# Patient Record
Sex: Female | Born: 1950 | Race: White | Hispanic: No
Health system: Southern US, Community
[De-identification: ages and names within clinical notes are randomized; demographics above are authoritative.]

---

## 2005-07-21 ENCOUNTER — Ambulatory Visit: Payer: Self-pay | Admitting: Unknown Physician Specialty

## 2005-09-30 ENCOUNTER — Ambulatory Visit: Payer: Self-pay | Admitting: Internal Medicine

## 2006-01-27 ENCOUNTER — Ambulatory Visit: Payer: Self-pay | Admitting: Gastroenterology

## 2006-09-11 ENCOUNTER — Ambulatory Visit: Payer: Self-pay | Admitting: Unknown Physician Specialty

## 2006-10-05 ENCOUNTER — Ambulatory Visit: Payer: Self-pay | Admitting: Unknown Physician Specialty

## 2007-10-15 ENCOUNTER — Ambulatory Visit: Payer: Self-pay | Admitting: Internal Medicine

## 2008-10-16 ENCOUNTER — Ambulatory Visit: Payer: Self-pay | Admitting: Internal Medicine

## 2009-02-16 ENCOUNTER — Ambulatory Visit: Payer: Self-pay | Admitting: Unknown Physician Specialty

## 2010-03-04 ENCOUNTER — Ambulatory Visit: Payer: Self-pay | Admitting: Internal Medicine

## 2010-03-09 ENCOUNTER — Ambulatory Visit: Payer: Self-pay | Admitting: Internal Medicine

## 2010-04-17 ENCOUNTER — Ambulatory Visit: Payer: Self-pay

## 2011-03-09 ENCOUNTER — Ambulatory Visit: Payer: Self-pay | Admitting: Internal Medicine

## 2011-10-19 ENCOUNTER — Inpatient Hospital Stay: Payer: Self-pay | Admitting: Internal Medicine

## 2011-10-19 LAB — CBC WITH DIFFERENTIAL/PLATELET
Basophil %: 0.1 %
Eosinophil #: 0 10*3/uL (ref 0.0–0.7)
Eosinophil %: 0 %
HGB: 12.2 g/dL (ref 12.0–16.0)
Lymphocyte %: 8.5 %
MCH: 30 pg (ref 26.0–34.0)
MCV: 89 fL (ref 80–100)
Monocyte #: 0.1 10*3/uL (ref 0.0–0.7)
Monocyte %: 1.5 %
Neutrophil %: 89.9 %
Platelet: 180 10*3/uL (ref 150–440)
RBC: 4.07 10*6/uL (ref 3.80–5.20)
WBC: 7.3 10*3/uL (ref 3.6–11.0)

## 2011-10-19 LAB — COMPREHENSIVE METABOLIC PANEL
Alkaline Phosphatase: 68 U/L (ref 50–136)
Calcium, Total: 8.6 mg/dL (ref 8.5–10.1)
Creatinine: 1.58 mg/dL — ABNORMAL HIGH (ref 0.60–1.30)
EGFR (African American): 43 — ABNORMAL LOW
EGFR (Non-African Amer.): 35 — ABNORMAL LOW
Glucose: 171 mg/dL — ABNORMAL HIGH (ref 65–99)
Potassium: 3.9 mmol/L (ref 3.5–5.1)
SGOT(AST): 28 U/L (ref 15–37)
SGPT (ALT): 25 U/L
Sodium: 130 mmol/L — ABNORMAL LOW (ref 136–145)
Total Protein: 6.8 g/dL (ref 6.4–8.2)

## 2011-10-19 LAB — URINALYSIS, COMPLETE
Bilirubin,UR: NEGATIVE
Hyaline Cast: 6
Ph: 5 (ref 4.5–8.0)
Specific Gravity: 1.028 (ref 1.003–1.030)
Squamous Epithelial: 2

## 2011-10-19 LAB — CK-MB: CK-MB: 2.8 ng/mL (ref 0.5–3.6)

## 2011-10-20 LAB — CBC WITH DIFFERENTIAL/PLATELET
Basophil #: 0 10*3/uL (ref 0.0–0.1)
Basophil %: 0.3 %
Eosinophil %: 0 %
HCT: 36 % (ref 35.0–47.0)
Lymphocyte #: 0.7 10*3/uL — ABNORMAL LOW (ref 1.0–3.6)
MCH: 29.9 pg (ref 26.0–34.0)
MCV: 90 fL (ref 80–100)
Monocyte #: 0.1 10*3/uL (ref 0.0–0.7)
Monocyte %: 1.1 %
Neutrophil #: 7.3 10*3/uL — ABNORMAL HIGH (ref 1.4–6.5)
Platelet: 190 10*3/uL (ref 150–440)
RBC: 4.02 10*6/uL (ref 3.80–5.20)

## 2011-10-20 LAB — TROPONIN I: Troponin-I: <0.02 ng/mL

## 2011-10-20 LAB — BASIC METABOLIC PANEL
Anion Gap: 15 (ref 7–16)
Co2: 19 mmol/L — ABNORMAL LOW (ref 21–32)
Creatinine: 1.29 mg/dL (ref 0.60–1.30)
EGFR (African American): 54 — ABNORMAL LOW
EGFR (Non-African Amer.): 45 — ABNORMAL LOW
Glucose: 115 mg/dL — ABNORMAL HIGH (ref 65–99)
Potassium: 4 mmol/L (ref 3.5–5.1)
Sodium: 136 mmol/L (ref 136–145)

## 2011-10-20 LAB — CK-MB: CK-MB: 2.9 ng/mL (ref 0.5–3.6)

## 2011-10-21 LAB — CBC WITH DIFFERENTIAL/PLATELET
Basophil %: 0 %
Eosinophil #: 0 10*3/uL (ref 0.0–0.7)
HCT: 32.9 % — ABNORMAL LOW (ref 35.0–47.0)
HGB: 11 g/dL — ABNORMAL LOW (ref 12.0–16.0)
Lymphocyte #: 1 10*3/uL (ref 1.0–3.6)
Lymphocyte %: 12 %
MCHC: 33.5 g/dL (ref 32.0–36.0)
Monocyte %: 3.2 %
Neutrophil #: 7.3 10*3/uL — ABNORMAL HIGH (ref 1.4–6.5)
Neutrophil %: 84.8 %
Platelet: 229 10*3/uL (ref 150–440)
RBC: 3.66 10*6/uL — ABNORMAL LOW (ref 3.80–5.20)
WBC: 8.6 10*3/uL (ref 3.6–11.0)

## 2011-10-21 LAB — BASIC METABOLIC PANEL
BUN: 31 mg/dL — ABNORMAL HIGH (ref 7–18)
Calcium, Total: 8.8 mg/dL (ref 8.5–10.1)
Chloride: 106 mmol/L (ref 98–107)
EGFR (Non-African Amer.): >60
Glucose: 179 mg/dL — ABNORMAL HIGH (ref 65–99)
Osmolality: 290 (ref 275–301)
Potassium: 4 mmol/L (ref 3.5–5.1)
Sodium: 140 mmol/L (ref 136–145)

## 2011-10-22 LAB — BASIC METABOLIC PANEL
Anion Gap: 11 (ref 7–16)
BUN: 32 mg/dL — ABNORMAL HIGH (ref 7–18)
Co2: 22 mmol/L (ref 21–32)
Creatinine: 1.06 mg/dL (ref 0.60–1.30)
EGFR (African American): >60
EGFR (Non-African Amer.): 56 — ABNORMAL LOW
Glucose: 176 mg/dL — ABNORMAL HIGH (ref 65–99)

## 2011-10-23 LAB — CBC WITH DIFFERENTIAL/PLATELET
Basophil #: 0 10*3/uL (ref 0.0–0.1)
Eosinophil #: 0 10*3/uL (ref 0.0–0.7)
Eosinophil %: 0 %
HGB: 12.2 g/dL (ref 12.0–16.0)
Lymphocyte #: 1.4 10*3/uL (ref 1.0–3.6)
Lymphocyte %: 13.9 %
MCH: 29.9 pg (ref 26.0–34.0)
MCHC: 33.3 g/dL (ref 32.0–36.0)
MCV: 90 fL (ref 80–100)
Monocyte #: 0.7 10*3/uL (ref 0.0–0.7)
Monocyte %: 6.5 %
Neutrophil #: 7.9 10*3/uL — ABNORMAL HIGH (ref 1.4–6.5)
Neutrophil %: 79.5 %
Platelet: 258 10*3/uL (ref 150–440)
RBC: 4.07 10*6/uL (ref 3.80–5.20)
RDW: 14.9 % — ABNORMAL HIGH (ref 11.5–14.5)
WBC: 10 10*3/uL (ref 3.6–11.0)

## 2011-10-25 LAB — BASIC METABOLIC PANEL
Anion Gap: 12 (ref 7–16)
BUN: 31 mg/dL — ABNORMAL HIGH (ref 7–18)
Calcium, Total: 9.7 mg/dL (ref 8.5–10.1)
Co2: 23 mmol/L (ref 21–32)
EGFR (African American): >60
EGFR (Non-African Amer.): 59 — ABNORMAL LOW
Glucose: 112 mg/dL — ABNORMAL HIGH (ref 65–99)
Osmolality: 287 (ref 275–301)
Potassium: 4 mmol/L (ref 3.5–5.1)
Sodium: 140 mmol/L (ref 136–145)

## 2011-10-25 LAB — CBC WITH DIFFERENTIAL/PLATELET
Basophil #: 0 10*3/uL (ref 0.0–0.1)
Eosinophil #: 0.1 10*3/uL (ref 0.0–0.7)
HCT: 40.4 % (ref 35.0–47.0)
Lymphocyte #: 3.5 10*3/uL (ref 1.0–3.6)
MCH: 29.5 pg (ref 26.0–34.0)
MCHC: 32.9 g/dL (ref 32.0–36.0)
MCV: 90 fL (ref 80–100)
Neutrophil #: 7.8 10*3/uL — ABNORMAL HIGH (ref 1.4–6.5)
Platelet: 282 10*3/uL (ref 150–440)
RDW: 15.1 % — ABNORMAL HIGH (ref 11.5–14.5)

## 2011-10-25 LAB — EXPECTORATED SPUTUM ASSESSMENT W REFEX TO RESP CULTURE

## 2011-10-25 LAB — CULTURE, BLOOD (SINGLE)

## 2012-03-13 ENCOUNTER — Ambulatory Visit: Payer: Self-pay | Admitting: Internal Medicine

## 2013-01-07 ENCOUNTER — Inpatient Hospital Stay: Payer: Self-pay | Admitting: Internal Medicine

## 2013-01-07 LAB — CBC WITH DIFFERENTIAL/PLATELET
Basophil #: 0 10*3/uL (ref 0.0–0.1)
Basophil %: 0.2 %
Eosinophil #: 0 10*3/uL (ref 0.0–0.7)
HCT: 39.4 % (ref 35.0–47.0)
Lymphocyte %: 7.1 %
MCH: 30 pg (ref 26.0–34.0)
MCV: 87 fL (ref 80–100)
Monocyte %: 6.5 %
Neutrophil %: 86.2 %
Platelet: 138 10*3/uL — ABNORMAL LOW (ref 150–440)
RDW: 14.4 % (ref 11.5–14.5)
WBC: 17.3 10*3/uL — ABNORMAL HIGH (ref 3.6–11.0)

## 2013-01-07 LAB — BASIC METABOLIC PANEL
Anion Gap: 9 (ref 7–16)
BUN: 16 mg/dL (ref 7–18)
Calcium, Total: 9.1 mg/dL (ref 8.5–10.1)
Chloride: 101 mmol/L (ref 98–107)
EGFR (African American): 54 — ABNORMAL LOW
Osmolality: 276 (ref 275–301)

## 2013-01-07 LAB — URINALYSIS, COMPLETE
Bilirubin,UR: NEGATIVE
Glucose,UR: >=500 mg/dL (ref 0–75)
Ph: 5 (ref 4.5–8.0)
Protein: 100
RBC,UR: 23 /HPF (ref 0–5)
Squamous Epithelial: 10

## 2013-01-07 LAB — TROPONIN I: Troponin-I: <0.02 ng/mL

## 2013-01-07 LAB — CK TOTAL AND CKMB (NOT AT ARMC)
CK, Total: 369 U/L — ABNORMAL HIGH (ref 21–215)
CK, Total: 563 U/L — ABNORMAL HIGH (ref 21–215)
CK-MB: 5.1 ng/mL — ABNORMAL HIGH (ref 0.5–3.6)

## 2013-01-08 LAB — CBC WITH DIFFERENTIAL/PLATELET
Eosinophil %: 0.7 %
HGB: 11.1 g/dL — ABNORMAL LOW (ref 12.0–16.0)
Lymphocyte #: 1.6 10*3/uL (ref 1.0–3.6)
MCH: 29.2 pg (ref 26.0–34.0)
Monocyte #: 0.6 x10 3/mm (ref 0.2–0.9)
Neutrophil #: 8.5 10*3/uL — ABNORMAL HIGH (ref 1.4–6.5)
Platelet: 123 10*3/uL — ABNORMAL LOW (ref 150–440)
RBC: 3.79 10*6/uL — ABNORMAL LOW (ref 3.80–5.20)
RDW: 14.6 % — ABNORMAL HIGH (ref 11.5–14.5)

## 2013-01-08 LAB — BASIC METABOLIC PANEL
Anion Gap: 6 — ABNORMAL LOW (ref 7–16)
BUN: 15 mg/dL (ref 7–18)
Calcium, Total: 8.6 mg/dL (ref 8.5–10.1)
Creatinine: 0.99 mg/dL (ref 0.60–1.30)
EGFR (African American): >60
Potassium: 3.8 mmol/L (ref 3.5–5.1)
Sodium: 139 mmol/L (ref 136–145)

## 2013-01-08 LAB — TROPONIN I: Troponin-I: <0.02 ng/mL

## 2013-01-08 LAB — CK TOTAL AND CKMB (NOT AT ARMC)
CK, Total: 538 U/L — ABNORMAL HIGH (ref 21–215)
CK-MB: 4.7 ng/mL — ABNORMAL HIGH (ref 0.5–3.6)

## 2013-01-09 LAB — BASIC METABOLIC PANEL
Anion Gap: 4 — ABNORMAL LOW (ref 7–16)
BUN: 15 mg/dL (ref 7–18)
Calcium, Total: 8.5 mg/dL (ref 8.5–10.1)
Chloride: 111 mmol/L — ABNORMAL HIGH (ref 98–107)
Creatinine: 0.95 mg/dL (ref 0.60–1.30)
EGFR (Non-African Amer.): >60
Glucose: 153 mg/dL — ABNORMAL HIGH (ref 65–99)
Osmolality: 281 (ref 275–301)
Potassium: 3.8 mmol/L (ref 3.5–5.1)
Sodium: 139 mmol/L (ref 136–145)

## 2013-01-09 LAB — CBC WITH DIFFERENTIAL/PLATELET
Basophil %: 0.7 %
Eosinophil %: 2.1 %
HCT: 32.5 % — ABNORMAL LOW (ref 35.0–47.0)
Lymphocyte #: 1.5 10*3/uL (ref 1.0–3.6)
Lymphocyte %: 24 %
MCH: 31.1 pg (ref 26.0–34.0)
MCHC: 35.5 g/dL (ref 32.0–36.0)
Monocyte #: 0.3 x10 3/mm (ref 0.2–0.9)
RDW: 14.9 % — ABNORMAL HIGH (ref 11.5–14.5)
WBC: 6.1 10*3/uL (ref 3.6–11.0)

## 2013-01-09 LAB — URINE CULTURE

## 2013-01-10 LAB — CBC WITH DIFFERENTIAL/PLATELET
Basophil #: 0 10*3/uL (ref 0.0–0.1)
Eosinophil #: 0.2 10*3/uL (ref 0.0–0.7)
Eosinophil %: 2.9 %
HGB: 12.5 g/dL (ref 12.0–16.0)
Lymphocyte #: 2.2 10*3/uL (ref 1.0–3.6)
Lymphocyte %: 39.6 %
Neutrophil #: 2.8 10*3/uL (ref 1.4–6.5)
Platelet: 210 10*3/uL (ref 150–440)
RBC: 4.15 10*6/uL (ref 3.80–5.20)
WBC: 5.6 10*3/uL (ref 3.6–11.0)

## 2013-01-10 LAB — BASIC METABOLIC PANEL
Anion Gap: 6 — ABNORMAL LOW (ref 7–16)
BUN: 14 mg/dL (ref 7–18)
Calcium, Total: 9.2 mg/dL (ref 8.5–10.1)
EGFR (African American): >60
Glucose: 131 mg/dL — ABNORMAL HIGH (ref 65–99)
Osmolality: 282 (ref 275–301)
Sodium: 140 mmol/L (ref 136–145)

## 2013-01-13 LAB — CULTURE, BLOOD (SINGLE)

## 2013-05-16 ENCOUNTER — Ambulatory Visit: Payer: Self-pay | Admitting: Internal Medicine

## 2013-06-11 ENCOUNTER — Ambulatory Visit: Payer: Self-pay | Admitting: Ophthalmology

## 2013-06-11 DIAGNOSIS — I499 Cardiac arrhythmia, unspecified: Secondary | ICD-10-CM

## 2013-06-25 ENCOUNTER — Ambulatory Visit: Payer: Self-pay | Admitting: Ophthalmology

## 2013-08-20 ENCOUNTER — Ambulatory Visit: Payer: Self-pay | Admitting: Ophthalmology

## 2013-09-27 ENCOUNTER — Emergency Department: Payer: Self-pay | Admitting: Emergency Medicine

## 2013-09-27 LAB — CBC
HCT: 41.7 % (ref 35.0–47.0)
MCH: 27.1 pg (ref 26.0–34.0)
Platelet: 200 10*3/uL (ref 150–440)
RDW: 15.2 % — ABNORMAL HIGH (ref 11.5–14.5)
WBC: 9 10*3/uL (ref 3.6–11.0)

## 2013-09-27 LAB — BASIC METABOLIC PANEL
Anion Gap: 13 (ref 7–16)
BUN: 21 mg/dL — ABNORMAL HIGH (ref 7–18)
Calcium, Total: 9.6 mg/dL (ref 8.5–10.1)
Creatinine: 1.12 mg/dL (ref 0.60–1.30)
EGFR (African American): >60
Osmolality: 286 (ref 275–301)
Potassium: 4.1 mmol/L (ref 3.5–5.1)
Sodium: 137 mmol/L (ref 136–145)

## 2013-09-27 LAB — TROPONIN I: Troponin-I: <0.02 ng/mL

## 2013-11-18 ENCOUNTER — Telehealth: Payer: Self-pay

## 2013-11-18 NOTE — Telephone Encounter (Signed)
Entered chart by error.  ASL

## 2014-07-31 ENCOUNTER — Ambulatory Visit: Payer: Self-pay | Admitting: Internal Medicine

## 2014-08-03 IMAGING — CR DG CHEST 2V
1 series · 3 of 3 positions shown · non-contrast
Comparison: Chest radiographs 01/07/2013 and earlier

CLINICAL DATA: Shortness of breath worsening over the last week.

EXAM:
CHEST  2 VIEW

[Series 1: pa · 0.17mm/px · 3 of 3 slices shown]
[im 1/3]
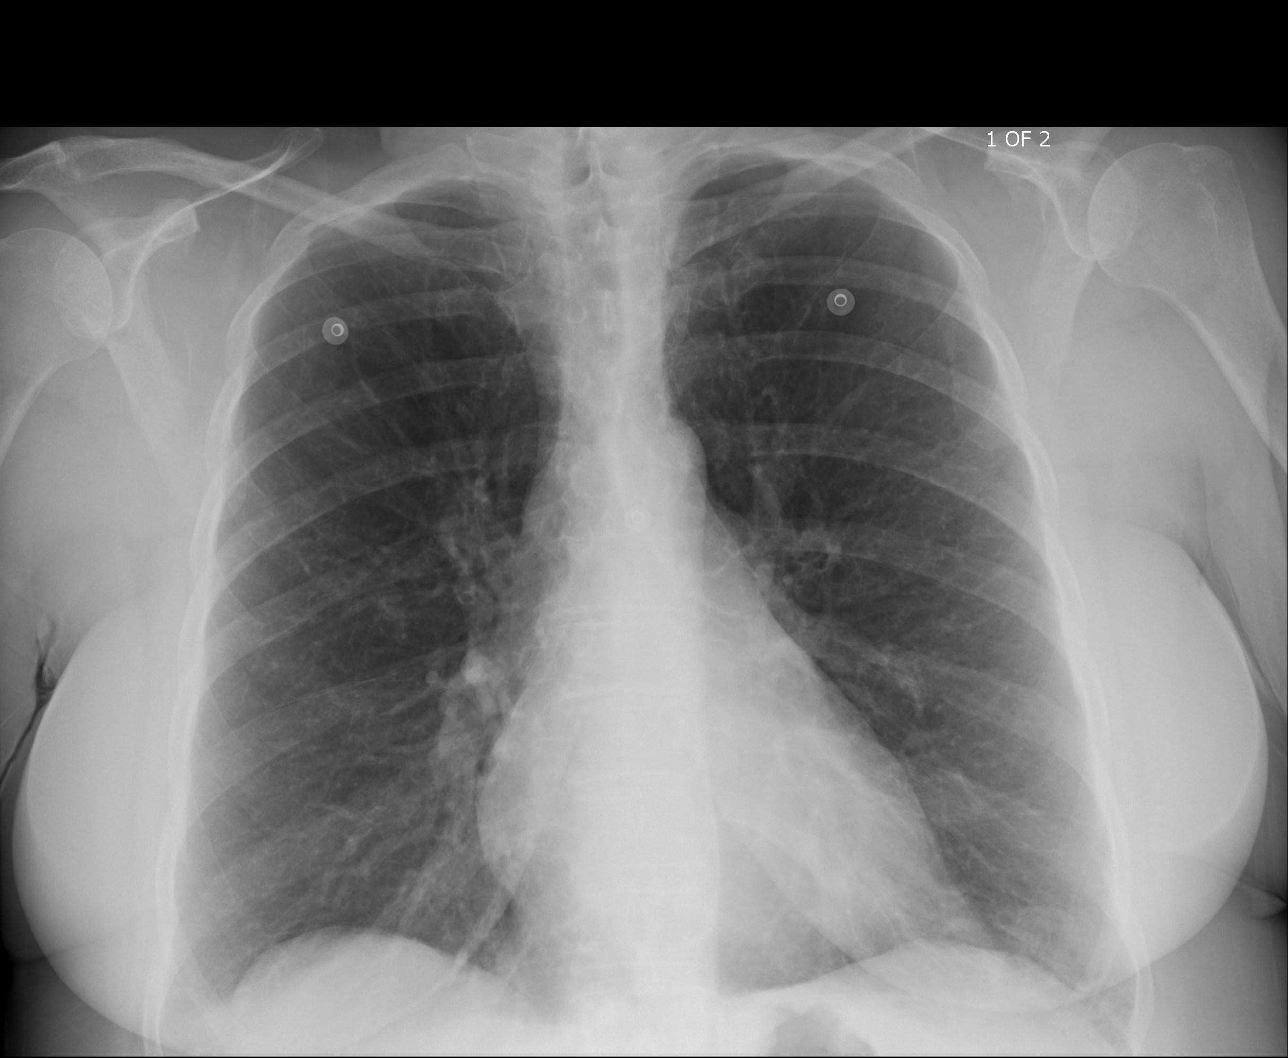
[im 2/3]
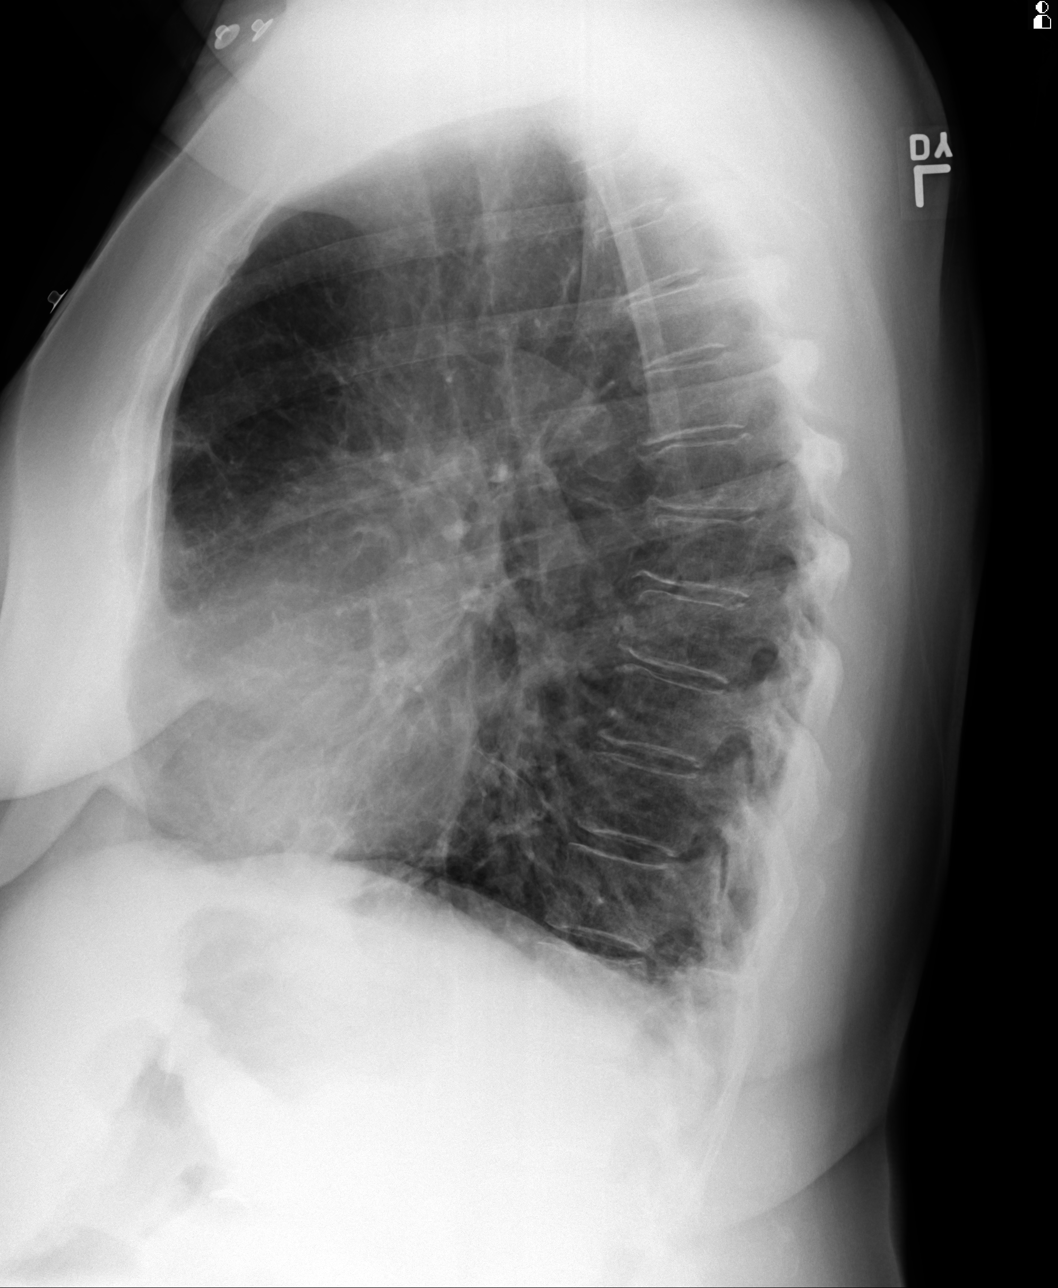
[im 3/3]
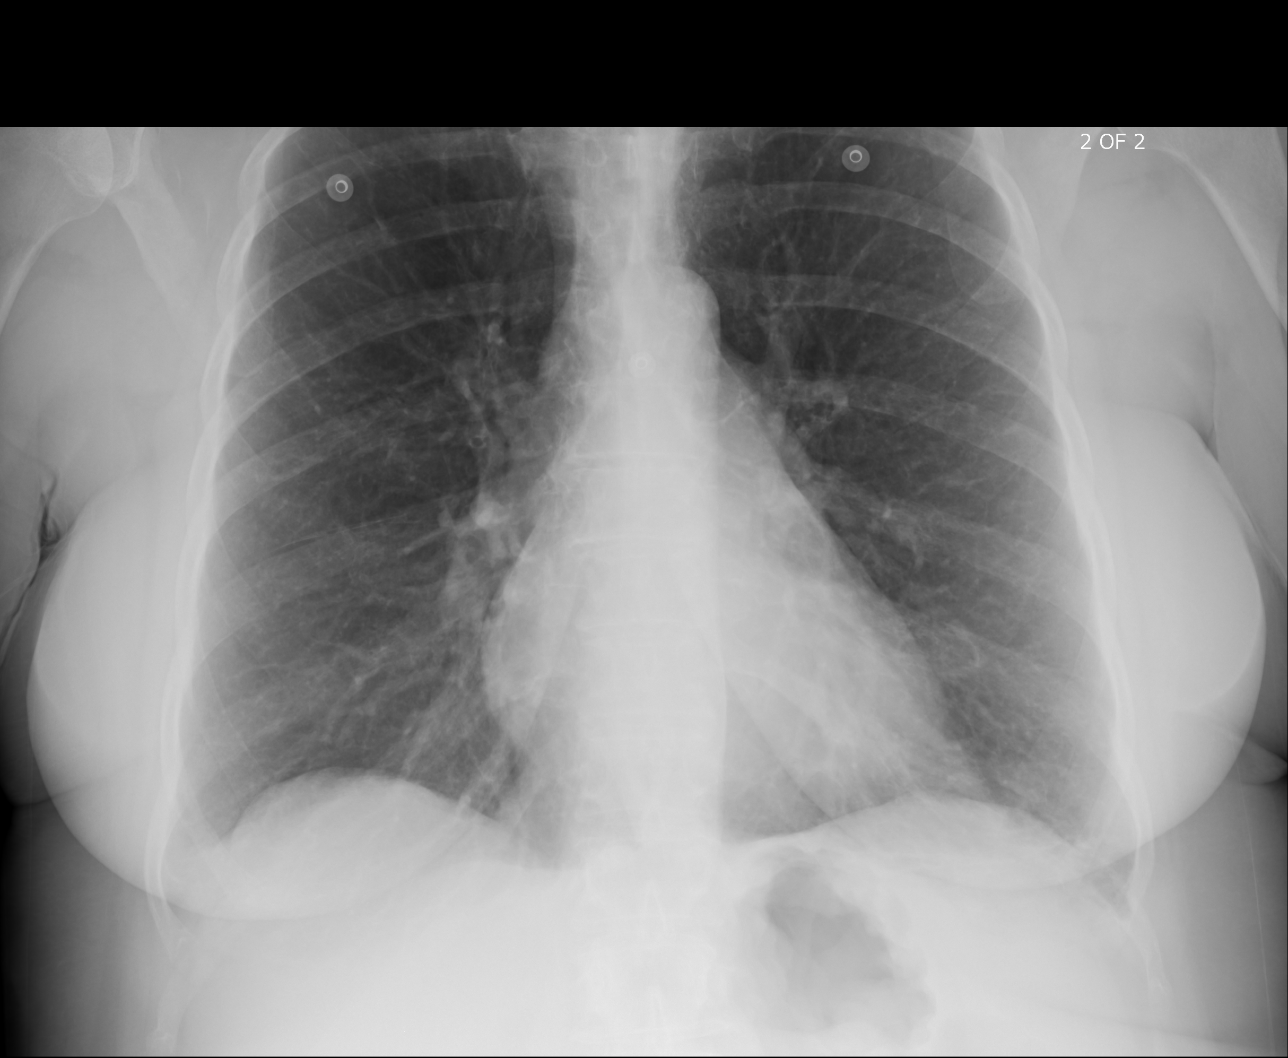

[3 of 3 positions shown; findings below may reference images not displayed]

FINDINGS: The cardiomediastinal silhouette is within normal limits. The
patient has taken a greater inspiration, and there is now
hyperinflation of the lungs. There may be a small amount of
parenchymal opacity within the posterior lateral basilar right lower
lobe. The remainder of the right lung and entirety of the left lung
appear clear. No definite pleural effusion or pneumothorax is
identified. No acute osseous abnormality is seen.
IMPRESSION: Improved lung inflation. Possible small focus of parenchymal opacity
in the basilar right lower lobe, which could reflect atelectasis,
developing infection, or artifact.

## 2014-08-07 ENCOUNTER — Ambulatory Visit: Payer: Self-pay | Admitting: Gastroenterology

## 2014-09-16 ENCOUNTER — Ambulatory Visit: Payer: Self-pay | Admitting: Internal Medicine

## 2014-09-19 ENCOUNTER — Inpatient Hospital Stay: Payer: Self-pay | Admitting: Internal Medicine

## 2014-09-19 LAB — CBC WITH DIFFERENTIAL/PLATELET
BASOS ABS: 0 10*3/uL (ref 0.0–0.1)
Basophil %: 0.6 %
Eosinophil #: 0.1 10*3/uL (ref 0.0–0.7)
Eosinophil %: 1.1 %
HCT: 36.7 % (ref 35.0–47.0)
HGB: 12.1 g/dL (ref 12.0–16.0)
LYMPHS ABS: 1 10*3/uL (ref 1.0–3.6)
Lymphocyte %: 12.3 %
MCH: 23.9 pg — ABNORMAL LOW (ref 26.0–34.0)
MCHC: 33 g/dL (ref 32.0–36.0)
MCV: 72 fL — ABNORMAL LOW (ref 80–100)
Monocyte #: 0.6 x10 3/mm (ref 0.2–0.9)
Monocyte %: 7.1 %
Neutrophil #: 6.2 10*3/uL (ref 1.4–6.5)
Neutrophil %: 78.9 %
Platelet: 241 10*3/uL (ref 150–440)
RBC: 5.08 10*6/uL (ref 3.80–5.20)
RDW: 17.6 % — ABNORMAL HIGH (ref 11.5–14.5)
WBC: 7.9 10*3/uL (ref 3.6–11.0)

## 2014-09-19 LAB — COMPREHENSIVE METABOLIC PANEL
ALBUMIN: 3.7 g/dL (ref 3.4–5.0)
ALK PHOS: 114 U/L
ANION GAP: 9 (ref 7–16)
BUN: 10 mg/dL (ref 7–18)
Bilirubin,Total: 1.1 mg/dL — ABNORMAL HIGH (ref 0.2–1.0)
CHLORIDE: 90 mmol/L — AB (ref 98–107)
CO2: 22 mmol/L (ref 21–32)
Calcium, Total: 9.1 mg/dL (ref 8.5–10.1)
Creatinine: 0.76 mg/dL (ref 0.60–1.30)
EGFR (African American): >60
GLUCOSE: 197 mg/dL — AB (ref 65–99)
Osmolality: 249 (ref 275–301)
POTASSIUM: 4.5 mmol/L (ref 3.5–5.1)
SGOT(AST): 62 U/L — ABNORMAL HIGH (ref 15–37)
SGPT (ALT): 60 U/L
Sodium: 121 mmol/L — ABNORMAL LOW (ref 136–145)
Total Protein: 7.2 g/dL (ref 6.4–8.2)

## 2014-09-19 LAB — URINALYSIS, COMPLETE
Bacteria: NONE SEEN
Bilirubin,UR: NEGATIVE
Blood: NEGATIVE
Glucose,UR: >=500 mg/dL (ref 0–75)
LEUKOCYTE ESTERASE: NEGATIVE
NITRITE: NEGATIVE
Ph: 6 (ref 4.5–8.0)
RBC,UR: 1 /HPF (ref 0–5)
SPECIFIC GRAVITY: 1.027 (ref 1.003–1.030)
WBC UR: <1 /HPF (ref 0–5)

## 2014-09-19 LAB — LIPASE, BLOOD: Lipase: 450 U/L — ABNORMAL HIGH (ref 73–393)

## 2014-09-19 LAB — TROPONIN I: Troponin-I: <0.02 ng/mL

## 2014-09-20 LAB — CBC WITH DIFFERENTIAL/PLATELET
BASOS PCT: 0.4 %
Basophil #: 0 10*3/uL (ref 0.0–0.1)
Eosinophil #: 0.1 10*3/uL (ref 0.0–0.7)
Eosinophil %: 1.1 %
HCT: 33.1 % — ABNORMAL LOW (ref 35.0–47.0)
HGB: 10.7 g/dL — ABNORMAL LOW (ref 12.0–16.0)
Lymphocyte #: 1.5 10*3/uL (ref 1.0–3.6)
Lymphocyte %: 20.7 %
MCH: 23.8 pg — AB (ref 26.0–34.0)
MCHC: 32.5 g/dL (ref 32.0–36.0)
MCV: 73 fL — ABNORMAL LOW (ref 80–100)
MONOS PCT: 9.3 %
Monocyte #: 0.7 x10 3/mm (ref 0.2–0.9)
Neutrophil #: 4.9 10*3/uL (ref 1.4–6.5)
Neutrophil %: 68.5 %
Platelet: 195 10*3/uL (ref 150–440)
RBC: 4.51 10*6/uL (ref 3.80–5.20)
RDW: 17.6 % — ABNORMAL HIGH (ref 11.5–14.5)
WBC: 6.8 10*3/uL (ref 3.6–11.0)

## 2014-09-20 LAB — BASIC METABOLIC PANEL
Anion Gap: 4 — ABNORMAL LOW (ref 7–16)
BUN: 8 mg/dL (ref 7–18)
CREATININE: 0.65 mg/dL (ref 0.60–1.30)
Calcium, Total: 8.6 mg/dL (ref 8.5–10.1)
Chloride: 95 mmol/L — ABNORMAL LOW (ref 98–107)
Co2: 20 mmol/L — ABNORMAL LOW (ref 21–32)
EGFR (African American): >60
Glucose: 106 mg/dL — ABNORMAL HIGH (ref 65–99)
Osmolality: 239 (ref 275–301)
Potassium: 3.8 mmol/L (ref 3.5–5.1)
Sodium: 119 mmol/L — CL (ref 136–145)

## 2014-09-20 LAB — SODIUM, URINE, RANDOM: Sodium, Urine Random: 58 mmol/L (ref 20–110)

## 2014-09-20 LAB — OSMOLALITY, URINE: Osmolality: 677 mOsm/kg

## 2014-09-20 LAB — TSH: Thyroid Stimulating Horm: 0.6 u[IU]/mL

## 2014-09-21 LAB — CBC WITH DIFFERENTIAL/PLATELET
Basophil #: 0 10*3/uL (ref 0.0–0.1)
Basophil %: 0.4 %
EOS ABS: 0 10*3/uL (ref 0.0–0.7)
EOS PCT: 0.2 %
HCT: 30.6 % — ABNORMAL LOW (ref 35.0–47.0)
HGB: 10 g/dL — AB (ref 12.0–16.0)
LYMPHS ABS: 1.3 10*3/uL (ref 1.0–3.6)
Lymphocyte %: 18.1 %
MCH: 23.9 pg — AB (ref 26.0–34.0)
MCHC: 32.7 g/dL (ref 32.0–36.0)
MCV: 73 fL — AB (ref 80–100)
MONOS PCT: 10.4 %
Monocyte #: 0.7 x10 3/mm (ref 0.2–0.9)
Neutrophil #: 5 10*3/uL (ref 1.4–6.5)
Neutrophil %: 70.9 %
PLATELETS: 175 10*3/uL (ref 150–440)
RBC: 4.19 10*6/uL (ref 3.80–5.20)
RDW: 17.2 % — ABNORMAL HIGH (ref 11.5–14.5)
WBC: 7.1 10*3/uL (ref 3.6–11.0)

## 2014-09-21 LAB — COMPREHENSIVE METABOLIC PANEL
ALBUMIN: 3.1 g/dL — AB (ref 3.4–5.0)
ALT: 57 U/L
AST: 62 U/L — AB (ref 15–37)
Alkaline Phosphatase: 101 U/L
Anion Gap: 11 (ref 7–16)
BILIRUBIN TOTAL: 0.8 mg/dL (ref 0.2–1.0)
BUN: 12 mg/dL (ref 7–18)
CREATININE: 0.76 mg/dL (ref 0.60–1.30)
Calcium, Total: 7.9 mg/dL — ABNORMAL LOW (ref 8.5–10.1)
Chloride: 92 mmol/L — ABNORMAL LOW (ref 98–107)
Co2: 20 mmol/L — ABNORMAL LOW (ref 21–32)
EGFR (African American): >60
GLUCOSE: 155 mg/dL — AB (ref 65–99)
OSMOLALITY: 251 (ref 275–301)
POTASSIUM: 3.6 mmol/L (ref 3.5–5.1)
Sodium: 123 mmol/L — ABNORMAL LOW (ref 136–145)
Total Protein: 6 g/dL — ABNORMAL LOW (ref 6.4–8.2)

## 2014-09-22 LAB — COMPREHENSIVE METABOLIC PANEL
ALT: 70 U/L — AB
Albumin: 3.2 g/dL — ABNORMAL LOW (ref 3.4–5.0)
Alkaline Phosphatase: 111 U/L
Anion Gap: 10 (ref 7–16)
BILIRUBIN TOTAL: 0.9 mg/dL (ref 0.2–1.0)
BUN: 11 mg/dL (ref 7–18)
CHLORIDE: 90 mmol/L — AB (ref 98–107)
CO2: 19 mmol/L — AB (ref 21–32)
Calcium, Total: 7.8 mg/dL — ABNORMAL LOW (ref 8.5–10.1)
Creatinine: 0.68 mg/dL (ref 0.60–1.30)
GLUCOSE: 189 mg/dL — AB (ref 65–99)
Osmolality: 245 (ref 275–301)
Potassium: 4.4 mmol/L (ref 3.5–5.1)
SGOT(AST): 70 U/L — ABNORMAL HIGH (ref 15–37)
SODIUM: 119 mmol/L — AB (ref 136–145)
Total Protein: 6.1 g/dL — ABNORMAL LOW (ref 6.4–8.2)

## 2014-09-22 LAB — CBC WITH DIFFERENTIAL/PLATELET
Basophil #: 0 10*3/uL (ref 0.0–0.1)
Basophil %: 0.1 %
EOS ABS: 0 10*3/uL (ref 0.0–0.7)
Eosinophil %: 0.1 %
HCT: 30.1 % — AB (ref 35.0–47.0)
HGB: 9.7 g/dL — ABNORMAL LOW (ref 12.0–16.0)
LYMPHS ABS: 0.7 10*3/uL — AB (ref 1.0–3.6)
Lymphocyte %: 9.5 %
MCH: 23.9 pg — ABNORMAL LOW (ref 26.0–34.0)
MCHC: 32.3 g/dL (ref 32.0–36.0)
MCV: 74 fL — AB (ref 80–100)
MONO ABS: 0.3 x10 3/mm (ref 0.2–0.9)
Monocyte %: 4.3 %
NEUTROS ABS: 6.7 10*3/uL — AB (ref 1.4–6.5)
Neutrophil %: 86 %
PLATELETS: 178 10*3/uL (ref 150–440)
RBC: 4.08 10*6/uL (ref 3.80–5.20)
RDW: 17.4 % — AB (ref 11.5–14.5)
WBC: 7.8 10*3/uL (ref 3.6–11.0)

## 2014-09-23 LAB — CBC WITH DIFFERENTIAL/PLATELET
BASOS ABS: 0 10*3/uL (ref 0.0–0.1)
Basophil %: 0.1 %
EOS ABS: 0 10*3/uL (ref 0.0–0.7)
Eosinophil %: 0 %
HCT: 36.4 % (ref 35.0–47.0)
HGB: 11.6 g/dL — AB (ref 12.0–16.0)
LYMPHS ABS: 0.8 10*3/uL — AB (ref 1.0–3.6)
Lymphocyte %: 8.7 %
MCH: 23.8 pg — AB (ref 26.0–34.0)
MCHC: 31.9 g/dL — ABNORMAL LOW (ref 32.0–36.0)
MCV: 75 fL — ABNORMAL LOW (ref 80–100)
MONOS PCT: 12.6 %
Monocyte #: 1.2 x10 3/mm — ABNORMAL HIGH (ref 0.2–0.9)
Neutrophil #: 7.7 10*3/uL — ABNORMAL HIGH (ref 1.4–6.5)
Neutrophil %: 78.6 %
Platelet: 303 10*3/uL (ref 150–440)
RBC: 4.86 10*6/uL (ref 3.80–5.20)
RDW: 17.8 % — ABNORMAL HIGH (ref 11.5–14.5)
WBC: 9.7 10*3/uL (ref 3.6–11.0)

## 2014-09-23 LAB — BASIC METABOLIC PANEL
BUN: 17 mg/dL (ref 7–18)
CALCIUM: 9.9 mg/dL (ref 8.5–10.1)
CO2: 23 mmol/L (ref 21–32)
Chloride: 105 mmol/L (ref 98–107)
Creatinine: 1.05 mg/dL (ref 0.60–1.30)
EGFR (African American): >60
GFR CALC NON AF AMER: 56 — AB
Glucose: 246 mg/dL — ABNORMAL HIGH (ref 65–99)
POTASSIUM: 4.2 mmol/L (ref 3.5–5.1)
Sodium: 136 mmol/L (ref 136–145)

## 2014-09-24 LAB — CBC WITH DIFFERENTIAL/PLATELET
Basophil #: 0 10*3/uL (ref 0.0–0.1)
Basophil %: 0.1 %
EOS PCT: 0.1 %
Eosinophil #: 0 10*3/uL (ref 0.0–0.7)
HCT: 33.3 % — ABNORMAL LOW (ref 35.0–47.0)
HGB: 10.6 g/dL — ABNORMAL LOW (ref 12.0–16.0)
Lymphocyte #: 0.6 10*3/uL — ABNORMAL LOW (ref 1.0–3.6)
Lymphocyte %: 9.7 %
MCH: 24 pg — ABNORMAL LOW (ref 26.0–34.0)
MCHC: 31.9 g/dL — AB (ref 32.0–36.0)
MCV: 75 fL — AB (ref 80–100)
Monocyte #: 0.4 x10 3/mm (ref 0.2–0.9)
Monocyte %: 6.7 %
Neutrophil #: 5.1 10*3/uL (ref 1.4–6.5)
Neutrophil %: 83.4 %
Platelet: 202 10*3/uL (ref 150–440)
RBC: 4.44 10*6/uL (ref 3.80–5.20)
RDW: 18.1 % — ABNORMAL HIGH (ref 11.5–14.5)
WBC: 6.1 10*3/uL (ref 3.6–11.0)

## 2014-09-24 LAB — BASIC METABOLIC PANEL
ANION GAP: 7 (ref 7–16)
BUN: 19 mg/dL — ABNORMAL HIGH (ref 7–18)
CALCIUM: 9.2 mg/dL (ref 8.5–10.1)
CREATININE: 1 mg/dL (ref 0.60–1.30)
Chloride: 99 mmol/L (ref 98–107)
Co2: 24 mmol/L (ref 21–32)
EGFR (Non-African Amer.): 59 — ABNORMAL LOW
Glucose: 249 mg/dL — ABNORMAL HIGH (ref 65–99)
OSMOLALITY: 271 (ref 275–301)
Potassium: 4.3 mmol/L (ref 3.5–5.1)
Sodium: 130 mmol/L — ABNORMAL LOW (ref 136–145)

## 2014-09-25 LAB — CBC WITH DIFFERENTIAL/PLATELET
BANDS NEUTROPHIL: 3 %
HCT: 32.9 % — ABNORMAL LOW (ref 35.0–47.0)
HGB: 10.3 g/dL — ABNORMAL LOW (ref 12.0–16.0)
Lymphocytes: 14 %
MCH: 23.6 pg — ABNORMAL LOW (ref 26.0–34.0)
MCHC: 31.2 g/dL — AB (ref 32.0–36.0)
MCV: 75 fL — ABNORMAL LOW (ref 80–100)
METAMYELOCYTE: 1 %
MONOS PCT: 4 %
Myelocyte: 2 %
Platelet: 187 10*3/uL (ref 150–440)
RBC: 4.36 10*6/uL (ref 3.80–5.20)
RDW: 17.8 % — AB (ref 11.5–14.5)
SEGMENTED NEUTROPHILS: 76 %
WBC: 6.8 10*3/uL (ref 3.6–11.0)

## 2014-09-25 LAB — BASIC METABOLIC PANEL
ANION GAP: 10 (ref 7–16)
BUN: 18 mg/dL (ref 7–18)
CALCIUM: 8.4 mg/dL — AB (ref 8.5–10.1)
CHLORIDE: 95 mmol/L — AB (ref 98–107)
CREATININE: 0.9 mg/dL (ref 0.60–1.30)
Co2: 24 mmol/L (ref 21–32)
EGFR (African American): >60
Glucose: 252 mg/dL — ABNORMAL HIGH (ref 65–99)
OSMOLALITY: 269 (ref 275–301)
POTASSIUM: 4.2 mmol/L (ref 3.5–5.1)
SODIUM: 129 mmol/L — AB (ref 136–145)

## 2014-09-25 LAB — EXPECTORATED SPUTUM ASSESSMENT W REFEX TO RESP CULTURE

## 2014-09-25 LAB — VANCOMYCIN, TROUGH: Vancomycin, Trough: 5 ug/mL — ABNORMAL LOW (ref 10–20)

## 2014-09-26 LAB — COMPREHENSIVE METABOLIC PANEL
ALBUMIN: 3.1 g/dL — AB (ref 3.4–5.0)
ANION GAP: 8 (ref 7–16)
Alkaline Phosphatase: 189 U/L — ABNORMAL HIGH
BILIRUBIN TOTAL: 1.5 mg/dL — AB (ref 0.2–1.0)
BUN: 21 mg/dL — AB (ref 7–18)
CREATININE: 0.98 mg/dL (ref 0.60–1.30)
Calcium, Total: 8.4 mg/dL — ABNORMAL LOW (ref 8.5–10.1)
Chloride: 91 mmol/L — ABNORMAL LOW (ref 98–107)
Co2: 27 mmol/L (ref 21–32)
EGFR (African American): >60
GLUCOSE: 386 mg/dL — AB (ref 65–99)
Osmolality: 272 (ref 275–301)
Potassium: 4.2 mmol/L (ref 3.5–5.1)
SGOT(AST): 170 U/L — ABNORMAL HIGH (ref 15–37)
SGPT (ALT): 328 U/L — ABNORMAL HIGH
Sodium: 126 mmol/L — ABNORMAL LOW (ref 136–145)
Total Protein: 6.3 g/dL — ABNORMAL LOW (ref 6.4–8.2)

## 2014-09-26 LAB — CBC WITH DIFFERENTIAL/PLATELET
BASOS ABS: 0 10*3/uL (ref 0.0–0.1)
Basophil %: 0.1 %
Eosinophil #: 0 10*3/uL (ref 0.0–0.7)
Eosinophil %: 0.1 %
HCT: 35.7 % (ref 35.0–47.0)
HGB: 11.3 g/dL — ABNORMAL LOW (ref 12.0–16.0)
LYMPHS PCT: 8.9 %
Lymphocyte #: 0.9 10*3/uL — ABNORMAL LOW (ref 1.0–3.6)
MCH: 23.8 pg — ABNORMAL LOW (ref 26.0–34.0)
MCHC: 31.7 g/dL — ABNORMAL LOW (ref 32.0–36.0)
MCV: 75 fL — AB (ref 80–100)
MONO ABS: 0.8 x10 3/mm (ref 0.2–0.9)
Monocyte %: 8 %
Neutrophil #: 8.1 10*3/uL — ABNORMAL HIGH (ref 1.4–6.5)
Neutrophil %: 82.9 %
Platelet: 215 10*3/uL (ref 150–440)
RBC: 4.76 10*6/uL (ref 3.80–5.20)
RDW: 17.8 % — ABNORMAL HIGH (ref 11.5–14.5)
WBC: 9.8 10*3/uL (ref 3.6–11.0)

## 2014-09-26 LAB — VANCOMYCIN, TROUGH: Vancomycin, Trough: 13 ug/mL (ref 10–20)

## 2014-09-27 LAB — BASIC METABOLIC PANEL
Anion Gap: 8 (ref 7–16)
BUN: 19 mg/dL — AB (ref 7–18)
CALCIUM: 8.6 mg/dL (ref 8.5–10.1)
CO2: 25 mmol/L (ref 21–32)
CREATININE: 0.87 mg/dL (ref 0.60–1.30)
Chloride: 95 mmol/L — ABNORMAL LOW (ref 98–107)
EGFR (Non-African Amer.): >60
GLUCOSE: 257 mg/dL — AB (ref 65–99)
OSMOLALITY: 268 (ref 275–301)
POTASSIUM: 4.4 mmol/L (ref 3.5–5.1)
Sodium: 128 mmol/L — ABNORMAL LOW (ref 136–145)

## 2014-09-27 LAB — VANCOMYCIN, TROUGH: Vancomycin, Trough: 13 ug/mL (ref 10–20)

## 2014-09-28 LAB — CBC WITH DIFFERENTIAL/PLATELET
Basophil #: 0 10*3/uL (ref 0.0–0.1)
Basophil %: 0.3 %
EOS PCT: 0.2 %
Eosinophil #: 0 10*3/uL (ref 0.0–0.7)
HCT: 34.4 % — AB (ref 35.0–47.0)
HGB: 10.9 g/dL — ABNORMAL LOW (ref 12.0–16.0)
Lymphocyte #: 0.5 10*3/uL — ABNORMAL LOW (ref 1.0–3.6)
Lymphocyte %: 5.7 %
MCH: 23.9 pg — ABNORMAL LOW (ref 26.0–34.0)
MCHC: 31.6 g/dL — AB (ref 32.0–36.0)
MCV: 76 fL — AB (ref 80–100)
MONOS PCT: 6.4 %
Monocyte #: 0.5 x10 3/mm (ref 0.2–0.9)
NEUTROS ABS: 7.3 10*3/uL — AB (ref 1.4–6.5)
NEUTROS PCT: 87.4 %
Platelet: 160 10*3/uL (ref 150–440)
RBC: 4.54 10*6/uL (ref 3.80–5.20)
RDW: 17.4 % — ABNORMAL HIGH (ref 11.5–14.5)
WBC: 8.4 10*3/uL (ref 3.6–11.0)

## 2014-09-28 LAB — BASIC METABOLIC PANEL
ANION GAP: 9 (ref 7–16)
BUN: 19 mg/dL — ABNORMAL HIGH (ref 7–18)
CO2: 25 mmol/L (ref 21–32)
CREATININE: 0.8 mg/dL (ref 0.60–1.30)
Calcium, Total: 8.2 mg/dL — ABNORMAL LOW (ref 8.5–10.1)
Chloride: 95 mmol/L — ABNORMAL LOW (ref 98–107)
EGFR (African American): >60
Glucose: 319 mg/dL — ABNORMAL HIGH (ref 65–99)
Osmolality: 273 (ref 275–301)
Potassium: 4.5 mmol/L (ref 3.5–5.1)
Sodium: 129 mmol/L — ABNORMAL LOW (ref 136–145)

## 2014-09-29 LAB — CHLORIDE, URINE, RANDOM: Chloride, Urine Random: 59 mmol/L (ref 55–125)

## 2014-09-29 LAB — BASIC METABOLIC PANEL
Anion Gap: 11 (ref 7–16)
BUN: 19 mg/dL — AB (ref 7–18)
CALCIUM: 8.5 mg/dL (ref 8.5–10.1)
CHLORIDE: 94 mmol/L — AB (ref 98–107)
Co2: 22 mmol/L (ref 21–32)
Creatinine: 0.89 mg/dL (ref 0.60–1.30)
EGFR (Non-African Amer.): >60
Glucose: 290 mg/dL — ABNORMAL HIGH (ref 65–99)
Osmolality: 268 (ref 275–301)
Potassium: 3.9 mmol/L (ref 3.5–5.1)
Sodium: 127 mmol/L — ABNORMAL LOW (ref 136–145)

## 2014-09-29 LAB — SODIUM, URINE, RANDOM: Sodium, Urine Random: 63 mmol/L (ref 20–110)

## 2014-09-29 LAB — CREATININE, SERUM
Creatinine: 0.78 mg/dL (ref 0.60–1.30)
EGFR (African American): >60
EGFR (Non-African Amer.): >60

## 2014-09-29 LAB — OSMOLALITY, URINE: Osmolality: 769 mOsm/kg

## 2014-09-30 LAB — CBC WITH DIFFERENTIAL/PLATELET
Basophil #: 0.1 10*3/uL (ref 0.0–0.1)
Basophil %: 0.5 %
EOS ABS: 0.1 10*3/uL (ref 0.0–0.7)
EOS PCT: 0.9 %
HCT: 36.8 % (ref 35.0–47.0)
HGB: 11.5 g/dL — ABNORMAL LOW (ref 12.0–16.0)
LYMPHS PCT: 6.8 %
Lymphocyte #: 0.8 10*3/uL — ABNORMAL LOW (ref 1.0–3.6)
MCH: 23.9 pg — ABNORMAL LOW (ref 26.0–34.0)
MCHC: 31.3 g/dL — AB (ref 32.0–36.0)
MCV: 76 fL — AB (ref 80–100)
MONO ABS: 0.8 x10 3/mm (ref 0.2–0.9)
MONOS PCT: 7.5 %
NEUTROS ABS: 9.3 10*3/uL — AB (ref 1.4–6.5)
NEUTROS PCT: 84.3 %
Platelet: 135 10*3/uL — ABNORMAL LOW (ref 150–440)
RBC: 4.82 10*6/uL (ref 3.80–5.20)
RDW: 18.1 % — ABNORMAL HIGH (ref 11.5–14.5)
WBC: 11 10*3/uL (ref 3.6–11.0)

## 2014-09-30 LAB — BASIC METABOLIC PANEL
Anion Gap: 6 — ABNORMAL LOW (ref 7–16)
BUN: 20 mg/dL — AB (ref 7–18)
CREATININE: 0.9 mg/dL (ref 0.60–1.30)
Calcium, Total: 8.9 mg/dL (ref 8.5–10.1)
Chloride: 95 mmol/L — ABNORMAL LOW (ref 98–107)
Co2: 28 mmol/L (ref 21–32)
EGFR (Non-African Amer.): >60
Glucose: 192 mg/dL — ABNORMAL HIGH (ref 65–99)
OSMOLALITY: 267 (ref 275–301)
Potassium: 4.6 mmol/L (ref 3.5–5.1)
SODIUM: 129 mmol/L — AB (ref 136–145)

## 2014-10-02 ENCOUNTER — Emergency Department: Payer: Self-pay | Admitting: Emergency Medicine

## 2014-10-02 LAB — COMPREHENSIVE METABOLIC PANEL
ALT: 323 U/L — AB
AST: 160 U/L — AB (ref 15–37)
Albumin: 2.8 g/dL — ABNORMAL LOW (ref 3.4–5.0)
Alkaline Phosphatase: 322 U/L — ABNORMAL HIGH
Anion Gap: 11 (ref 7–16)
BILIRUBIN TOTAL: 2.4 mg/dL — AB (ref 0.2–1.0)
BUN: 28 mg/dL — AB (ref 7–18)
CO2: 21 mmol/L (ref 21–32)
Calcium, Total: 9.1 mg/dL (ref 8.5–10.1)
Chloride: 106 mmol/L (ref 98–107)
Creatinine: 1.05 mg/dL (ref 0.60–1.30)
EGFR (African American): >60
EGFR (Non-African Amer.): 56 — ABNORMAL LOW
Glucose: 217 mg/dL — ABNORMAL HIGH (ref 65–99)
Osmolality: 288 (ref 275–301)
POTASSIUM: 4.2 mmol/L (ref 3.5–5.1)
Sodium: 138 mmol/L (ref 136–145)
TOTAL PROTEIN: 6.3 g/dL — AB (ref 6.4–8.2)

## 2014-10-02 LAB — CBC WITH DIFFERENTIAL/PLATELET
Basophil #: 0.1 10*3/uL (ref 0.0–0.1)
Basophil %: 0.8 %
EOS ABS: 0.1 10*3/uL (ref 0.0–0.7)
EOS PCT: 0.6 %
HCT: 40 % (ref 35.0–47.0)
HGB: 12.4 g/dL (ref 12.0–16.0)
LYMPHS ABS: 0.6 10*3/uL — AB (ref 1.0–3.6)
LYMPHS PCT: 3.8 %
MCH: 24 pg — ABNORMAL LOW (ref 26.0–34.0)
MCHC: 31.1 g/dL — ABNORMAL LOW (ref 32.0–36.0)
MCV: 77 fL — AB (ref 80–100)
Monocyte #: 0.5 x10 3/mm (ref 0.2–0.9)
Monocyte %: 3.1 %
NEUTROS PCT: 91.7 %
Neutrophil #: 13.8 10*3/uL — ABNORMAL HIGH (ref 1.4–6.5)
Platelet: 149 10*3/uL — ABNORMAL LOW (ref 150–440)
RBC: 5.18 10*6/uL (ref 3.80–5.20)
RDW: 18.6 % — AB (ref 11.5–14.5)
WBC: 15 10*3/uL — ABNORMAL HIGH (ref 3.6–11.0)

## 2014-10-02 LAB — URINALYSIS, COMPLETE
Bacteria: NONE SEEN
Bilirubin,UR: NEGATIVE
Blood: NEGATIVE
Glucose,UR: >=500 mg/dL (ref 0–75)
Ketone: NEGATIVE
LEUKOCYTE ESTERASE: NEGATIVE
NITRITE: NEGATIVE
PH: 6 (ref 4.5–8.0)
Protein: 30
RBC,UR: 2 /HPF (ref 0–5)
SPECIFIC GRAVITY: 1.032 (ref 1.003–1.030)
Squamous Epithelial: 1
Transitional Epi: <1
WBC UR: 2 /HPF (ref 0–5)

## 2014-10-02 LAB — LIPASE, BLOOD: LIPASE: 518 U/L — AB (ref 73–393)

## 2014-10-02 LAB — MAGNESIUM: Magnesium: 2.4 mg/dL

## 2014-10-02 LAB — TROPONIN I: Troponin-I: <0.02 ng/mL

## 2014-10-07 ENCOUNTER — Inpatient Hospital Stay: Payer: Self-pay | Admitting: Internal Medicine

## 2014-10-07 LAB — URINALYSIS, COMPLETE
BLOOD: NEGATIVE
Bacteria: NONE SEEN
Bilirubin,UR: NEGATIVE
Glucose,UR: >=500 mg/dL (ref 0–75)
Ketone: NEGATIVE
LEUKOCYTE ESTERASE: NEGATIVE
Nitrite: NEGATIVE
PH: 5 (ref 4.5–8.0)
RBC,UR: 4 /HPF (ref 0–5)
SPECIFIC GRAVITY: 1.024 (ref 1.003–1.030)
WBC UR: 9 /HPF (ref 0–5)

## 2014-10-07 LAB — CBC WITH DIFFERENTIAL/PLATELET
Basophil #: 0.1 10*3/uL (ref 0.0–0.1)
Basophil %: 0.8 %
Eosinophil #: 0 10*3/uL (ref 0.0–0.7)
Eosinophil %: 0.3 %
HCT: 39.1 % (ref 35.0–47.0)
HGB: 12.1 g/dL (ref 12.0–16.0)
Lymphocyte #: 0.9 10*3/uL — ABNORMAL LOW (ref 1.0–3.6)
Lymphocyte %: 9.5 %
MCH: 24.5 pg — ABNORMAL LOW (ref 26.0–34.0)
MCHC: 30.9 g/dL — ABNORMAL LOW (ref 32.0–36.0)
MCV: 79 fL — ABNORMAL LOW (ref 80–100)
Monocyte #: 0.4 x10 3/mm (ref 0.2–0.9)
Monocyte %: 4.6 %
NEUTROS ABS: 8.1 10*3/uL — AB (ref 1.4–6.5)
Neutrophil %: 84.8 %
Platelet: 104 10*3/uL — ABNORMAL LOW (ref 150–440)
RBC: 4.93 10*6/uL (ref 3.80–5.20)
RDW: 18.8 % — ABNORMAL HIGH (ref 11.5–14.5)
WBC: 9.5 10*3/uL (ref 3.6–11.0)

## 2014-10-07 LAB — COMPREHENSIVE METABOLIC PANEL
ALK PHOS: 542 U/L — AB
ANION GAP: 11 (ref 7–16)
Albumin: 2.8 g/dL — ABNORMAL LOW (ref 3.4–5.0)
BILIRUBIN TOTAL: 3.1 mg/dL — AB (ref 0.2–1.0)
BUN: 25 mg/dL — ABNORMAL HIGH (ref 7–18)
CREATININE: 1.08 mg/dL (ref 0.60–1.30)
Calcium, Total: 10.1 mg/dL (ref 8.5–10.1)
Chloride: 109 mmol/L — ABNORMAL HIGH (ref 98–107)
Co2: 22 mmol/L (ref 21–32)
EGFR (African American): >60
EGFR (Non-African Amer.): 54 — ABNORMAL LOW
Glucose: 194 mg/dL — ABNORMAL HIGH (ref 65–99)
Osmolality: 293 (ref 275–301)
POTASSIUM: 4.3 mmol/L (ref 3.5–5.1)
SGOT(AST): 189 U/L — ABNORMAL HIGH (ref 15–37)
SGPT (ALT): 223 U/L — ABNORMAL HIGH
SODIUM: 142 mmol/L (ref 136–145)
TOTAL PROTEIN: 5.5 g/dL — AB (ref 6.4–8.2)

## 2014-10-07 LAB — CULTURE, BLOOD (SINGLE)

## 2014-10-08 LAB — COMPREHENSIVE METABOLIC PANEL
ALT: 192 U/L — AB
ANION GAP: 6 — AB (ref 7–16)
Albumin: 2.4 g/dL — ABNORMAL LOW (ref 3.4–5.0)
Alkaline Phosphatase: 481 U/L — ABNORMAL HIGH
BILIRUBIN TOTAL: 2.2 mg/dL — AB (ref 0.2–1.0)
BUN: 21 mg/dL — AB (ref 7–18)
CALCIUM: 9.8 mg/dL (ref 8.5–10.1)
Chloride: 109 mmol/L — ABNORMAL HIGH (ref 98–107)
Co2: 24 mmol/L (ref 21–32)
Creatinine: 0.93 mg/dL (ref 0.60–1.30)
Glucose: 119 mg/dL — ABNORMAL HIGH (ref 65–99)
Osmolality: 282 (ref 275–301)
POTASSIUM: 4.2 mmol/L (ref 3.5–5.1)
SGOT(AST): 158 U/L — ABNORMAL HIGH (ref 15–37)
SODIUM: 139 mmol/L (ref 136–145)
TOTAL PROTEIN: 5.5 g/dL — AB (ref 6.4–8.2)

## 2014-10-08 LAB — CBC WITH DIFFERENTIAL/PLATELET
Basophil #: 0 10*3/uL (ref 0.0–0.1)
Basophil %: 0.5 %
Eosinophil #: 0 10*3/uL (ref 0.0–0.7)
Eosinophil %: 0.3 %
HCT: 36 % (ref 35.0–47.0)
HGB: 11.2 g/dL — ABNORMAL LOW (ref 12.0–16.0)
Lymphocyte #: 1.4 10*3/uL (ref 1.0–3.6)
Lymphocyte %: 14.2 %
MCH: 24.8 pg — ABNORMAL LOW (ref 26.0–34.0)
MCHC: 31 g/dL — ABNORMAL LOW (ref 32.0–36.0)
MCV: 80 fL (ref 80–100)
Monocyte #: 0.7 x10 3/mm (ref 0.2–0.9)
Monocyte %: 6.9 %
Neutrophil #: 7.8 10*3/uL — ABNORMAL HIGH (ref 1.4–6.5)
Neutrophil %: 78.1 %
Platelet: 90 10*3/uL — ABNORMAL LOW (ref 150–440)
RBC: 4.5 10*6/uL (ref 3.80–5.20)
RDW: 19.6 % — ABNORMAL HIGH (ref 11.5–14.5)
WBC: 9.9 10*3/uL (ref 3.6–11.0)

## 2014-10-09 LAB — CBC WITH DIFFERENTIAL/PLATELET
Basophil #: 0.1 10*3/uL (ref 0.0–0.1)
Basophil %: 0.8 %
Eosinophil #: 0 10*3/uL (ref 0.0–0.7)
Eosinophil %: 0.5 %
HCT: 35.6 % (ref 35.0–47.0)
HGB: 11.1 g/dL — AB (ref 12.0–16.0)
LYMPHS ABS: 1.7 10*3/uL (ref 1.0–3.6)
Lymphocyte %: 19.3 %
MCH: 24.9 pg — ABNORMAL LOW (ref 26.0–34.0)
MCHC: 31.2 g/dL — AB (ref 32.0–36.0)
MCV: 80 fL (ref 80–100)
MONO ABS: 0.7 x10 3/mm (ref 0.2–0.9)
MONOS PCT: 7.5 %
NEUTROS PCT: 71.9 %
Neutrophil #: 6.2 10*3/uL (ref 1.4–6.5)
PLATELETS: 87 10*3/uL — AB (ref 150–440)
RBC: 4.45 10*6/uL (ref 3.80–5.20)
RDW: 19.6 % — ABNORMAL HIGH (ref 11.5–14.5)
WBC: 8.6 10*3/uL (ref 3.6–11.0)

## 2014-10-09 LAB — BASIC METABOLIC PANEL
ANION GAP: 12 (ref 7–16)
BUN: 21 mg/dL — AB (ref 7–18)
CHLORIDE: 107 mmol/L (ref 98–107)
CO2: 23 mmol/L (ref 21–32)
Calcium, Total: 9.8 mg/dL (ref 8.5–10.1)
Creatinine: 0.97 mg/dL (ref 0.60–1.30)
EGFR (African American): >60
EGFR (Non-African Amer.): >60
Glucose: 88 mg/dL (ref 65–99)
Osmolality: 286 (ref 275–301)
Potassium: 4.1 mmol/L (ref 3.5–5.1)
Sodium: 142 mmol/L (ref 136–145)

## 2014-10-10 LAB — CBC WITH DIFFERENTIAL/PLATELET
Basophil #: 0 10*3/uL (ref 0.0–0.1)
Basophil %: 0.6 %
EOS ABS: 0 10*3/uL (ref 0.0–0.7)
Eosinophil %: 0.6 %
HCT: 35.8 % (ref 35.0–47.0)
HGB: 11.3 g/dL — AB (ref 12.0–16.0)
Lymphocyte #: 1.2 10*3/uL (ref 1.0–3.6)
Lymphocyte %: 14.8 %
MCH: 25.3 pg — ABNORMAL LOW (ref 26.0–34.0)
MCHC: 31.6 g/dL — ABNORMAL LOW (ref 32.0–36.0)
MCV: 80 fL (ref 80–100)
MONO ABS: 0.6 x10 3/mm (ref 0.2–0.9)
Monocyte %: 8.2 %
NEUTROS ABS: 5.9 10*3/uL (ref 1.4–6.5)
NEUTROS PCT: 75.8 %
Platelet: 85 10*3/uL — ABNORMAL LOW (ref 150–440)
RBC: 4.48 10*6/uL (ref 3.80–5.20)
RDW: 19.6 % — AB (ref 11.5–14.5)
WBC: 7.8 10*3/uL (ref 3.6–11.0)

## 2014-10-10 LAB — HEPATIC FUNCTION PANEL A (ARMC)
ALK PHOS: 660 U/L — AB
ALT: 231 U/L — AB
Albumin: 2.5 g/dL — ABNORMAL LOW (ref 3.4–5.0)
Bilirubin, Direct: 2.5 mg/dL — ABNORMAL HIGH (ref 0.0–0.2)
Bilirubin,Total: 3.5 mg/dL — ABNORMAL HIGH (ref 0.2–1.0)
SGOT(AST): 236 U/L — ABNORMAL HIGH (ref 15–37)
TOTAL PROTEIN: 5.4 g/dL — AB (ref 6.4–8.2)

## 2014-10-11 LAB — COMPREHENSIVE METABOLIC PANEL
ALK PHOS: 666 U/L — AB
Albumin: 2.4 g/dL — ABNORMAL LOW (ref 3.4–5.0)
Anion Gap: 7 (ref 7–16)
BILIRUBIN TOTAL: 4.3 mg/dL — AB (ref 0.2–1.0)
BUN: 21 mg/dL — ABNORMAL HIGH (ref 7–18)
CALCIUM: 10.3 mg/dL — AB (ref 8.5–10.1)
CO2: 26 mmol/L (ref 21–32)
Chloride: 104 mmol/L (ref 98–107)
Creatinine: 1.19 mg/dL (ref 0.60–1.30)
EGFR (African American): 59 — ABNORMAL LOW
GFR CALC NON AF AMER: 48 — AB
Glucose: 134 mg/dL — ABNORMAL HIGH (ref 65–99)
OSMOLALITY: 279 (ref 275–301)
Potassium: 4.5 mmol/L (ref 3.5–5.1)
SGOT(AST): 219 U/L — ABNORMAL HIGH (ref 15–37)
SGPT (ALT): 214 U/L — ABNORMAL HIGH
Sodium: 137 mmol/L (ref 136–145)
Total Protein: 5.3 g/dL — ABNORMAL LOW (ref 6.4–8.2)

## 2014-10-11 LAB — PROTIME-INR
INR: 1.2
Prothrombin Time: 15.4 secs — ABNORMAL HIGH (ref 11.5–14.7)

## 2014-10-13 DIAGNOSIS — Z01810 Encounter for preprocedural cardiovascular examination: Secondary | ICD-10-CM

## 2014-10-13 LAB — CBC WITH DIFFERENTIAL/PLATELET
BASOS PCT: 0.5 %
Basophil #: 0.1 10*3/uL (ref 0.0–0.1)
Eosinophil #: 0 10*3/uL (ref 0.0–0.7)
Eosinophil %: 0.1 %
HCT: 37.1 % (ref 35.0–47.0)
HGB: 11.7 g/dL — AB (ref 12.0–16.0)
Lymphocyte #: 1.4 10*3/uL (ref 1.0–3.6)
Lymphocyte %: 11.5 %
MCH: 25.4 pg — ABNORMAL LOW (ref 26.0–34.0)
MCHC: 31.7 g/dL — ABNORMAL LOW (ref 32.0–36.0)
MCV: 80 fL (ref 80–100)
Monocyte #: 1 x10 3/mm — ABNORMAL HIGH (ref 0.2–0.9)
Monocyte %: 8 %
NEUTROS PCT: 79.9 %
Neutrophil #: 10.1 10*3/uL — ABNORMAL HIGH (ref 1.4–6.5)
Platelet: 188 10*3/uL (ref 150–440)
RBC: 4.62 10*6/uL (ref 3.80–5.20)
RDW: 22.3 % — ABNORMAL HIGH (ref 11.5–14.5)
WBC: 12.6 10*3/uL — AB (ref 3.6–11.0)

## 2014-10-13 LAB — BASIC METABOLIC PANEL
ANION GAP: 8 (ref 7–16)
BUN: 26 mg/dL — ABNORMAL HIGH (ref 7–18)
Calcium, Total: 10.8 mg/dL — ABNORMAL HIGH (ref 8.5–10.1)
Chloride: 100 mmol/L (ref 98–107)
Co2: 27 mmol/L (ref 21–32)
Creatinine: 1.38 mg/dL — ABNORMAL HIGH (ref 0.60–1.30)
EGFR (African American): 49 — ABNORMAL LOW
GFR CALC NON AF AMER: 41 — AB
Glucose: 169 mg/dL — ABNORMAL HIGH (ref 65–99)
Osmolality: 279 (ref 275–301)
POTASSIUM: 4.9 mmol/L (ref 3.5–5.1)
Sodium: 135 mmol/L — ABNORMAL LOW (ref 136–145)

## 2014-10-13 LAB — PROTIME-INR
INR: 1.3
PROTHROMBIN TIME: 15.5 s — AB (ref 11.5–14.7)

## 2014-10-14 LAB — CBC WITH DIFFERENTIAL/PLATELET
Bands: 2 %
COMMENT - H1-COM3: NORMAL
HCT: 35.8 % (ref 35.0–47.0)
HGB: 11.2 g/dL — ABNORMAL LOW (ref 12.0–16.0)
Lymphocytes: 10 %
MCH: 25.5 pg — ABNORMAL LOW (ref 26.0–34.0)
MCHC: 31.4 g/dL — ABNORMAL LOW (ref 32.0–36.0)
MCV: 81 fL (ref 80–100)
MONOS PCT: 5 %
Platelet: 156 10*3/uL (ref 150–440)
RBC: 4.41 10*6/uL (ref 3.80–5.20)
RDW: 22.8 % — ABNORMAL HIGH (ref 11.5–14.5)
Segmented Neutrophils: 83 %
WBC: 9.7 10*3/uL (ref 3.6–11.0)

## 2014-10-14 LAB — BASIC METABOLIC PANEL
ANION GAP: 7 (ref 7–16)
BUN: 36 mg/dL — ABNORMAL HIGH (ref 7–18)
CREATININE: 1.25 mg/dL (ref 0.60–1.30)
Calcium, Total: 10.9 mg/dL — ABNORMAL HIGH (ref 8.5–10.1)
Chloride: 103 mmol/L (ref 98–107)
Co2: 27 mmol/L (ref 21–32)
GFR CALC AF AMER: 55 — AB
GFR CALC NON AF AMER: 46 — AB
GLUCOSE: 153 mg/dL — AB (ref 65–99)
Osmolality: 285 (ref 275–301)
POTASSIUM: 5.1 mmol/L (ref 3.5–5.1)
SODIUM: 137 mmol/L (ref 136–145)

## 2014-10-15 LAB — BASIC METABOLIC PANEL
Anion Gap: 8 (ref 7–16)
Anion Gap: 6 — ABNORMAL LOW (ref 7–16)
BUN: 38 mg/dL — ABNORMAL HIGH (ref 7–18)
BUN: 41 mg/dL — AB (ref 7–18)
CALCIUM: 10.5 mg/dL — AB (ref 8.5–10.1)
CALCIUM: 9.8 mg/dL (ref 8.5–10.1)
CHLORIDE: 101 mmol/L (ref 98–107)
CREATININE: 1.3 mg/dL (ref 0.60–1.30)
Chloride: 101 mmol/L (ref 98–107)
Co2: 25 mmol/L (ref 21–32)
Co2: 25 mmol/L (ref 21–32)
Creatinine: 1.32 mg/dL — ABNORMAL HIGH (ref 0.60–1.30)
EGFR (African American): 52 — ABNORMAL LOW
EGFR (Non-African Amer.): 44 — ABNORMAL LOW
GFR CALC AF AMER: 53 — AB
GFR CALC NON AF AMER: 43 — AB
Glucose: 151 mg/dL — ABNORMAL HIGH (ref 65–99)
Glucose: 209 mg/dL — ABNORMAL HIGH (ref 65–99)
OSMOLALITY: 280 (ref 275–301)
Osmolality: 281 (ref 275–301)
POTASSIUM: 5.1 mmol/L (ref 3.5–5.1)
POTASSIUM: 5.7 mmol/L — AB (ref 3.5–5.1)
Sodium: 134 mmol/L — ABNORMAL LOW (ref 136–145)
Sodium: 132 mmol/L — ABNORMAL LOW (ref 136–145)

## 2014-10-15 LAB — CBC WITH DIFFERENTIAL/PLATELET
Comment - H1-Com3: NORMAL
HCT: 35.8 % (ref 35.0–47.0)
HGB: 11.3 g/dL — AB (ref 12.0–16.0)
Lymphocytes: 15 %
MCH: 25.3 pg — AB (ref 26.0–34.0)
MCHC: 31.6 g/dL — ABNORMAL LOW (ref 32.0–36.0)
MCV: 80 fL (ref 80–100)
Monocytes: 4 %
PLATELETS: 173 10*3/uL (ref 150–440)
RBC: 4.47 10*6/uL (ref 3.80–5.20)
RDW: 23 % — AB (ref 11.5–14.5)
Segmented Neutrophils: 81 %
WBC: 10.7 10*3/uL (ref 3.6–11.0)

## 2014-10-15 LAB — URIC ACID: Uric Acid: 8.4 mg/dL — ABNORMAL HIGH (ref 2.6–6.0)

## 2014-10-16 LAB — BASIC METABOLIC PANEL
ANION GAP: 8 (ref 7–16)
Anion Gap: 9 (ref 7–16)
BUN: 48 mg/dL — AB (ref 7–18)
BUN: 48 mg/dL — ABNORMAL HIGH (ref 7–18)
CALCIUM: 9.2 mg/dL (ref 8.5–10.1)
CREATININE: 1.24 mg/dL (ref 0.60–1.30)
Calcium, Total: 8.7 mg/dL (ref 8.5–10.1)
Chloride: 100 mmol/L (ref 98–107)
Chloride: 101 mmol/L (ref 98–107)
Co2: 25 mmol/L (ref 21–32)
Co2: 25 mmol/L (ref 21–32)
Creatinine: 1.39 mg/dL — ABNORMAL HIGH (ref 0.60–1.30)
EGFR (African American): 49 — ABNORMAL LOW
EGFR (African American): 56 — ABNORMAL LOW
EGFR (Non-African Amer.): 40 — ABNORMAL LOW
EGFR (Non-African Amer.): 46 — ABNORMAL LOW
GLUCOSE: 284 mg/dL — AB (ref 65–99)
Glucose: 292 mg/dL — ABNORMAL HIGH (ref 65–99)
Osmolality: 289 (ref 275–301)
Osmolality: 293 (ref 275–301)
POTASSIUM: 4.9 mmol/L (ref 3.5–5.1)
Potassium: 5.3 mmol/L — ABNORMAL HIGH (ref 3.5–5.1)
SODIUM: 135 mmol/L — AB (ref 136–145)
Sodium: 133 mmol/L — ABNORMAL LOW (ref 136–145)

## 2014-10-16 LAB — CBC WITH DIFFERENTIAL/PLATELET
Bands: 3 %
Comment - H1-Com5: NORMAL
HCT: 34.6 % — AB (ref 35.0–47.0)
HGB: 11 g/dL — ABNORMAL LOW (ref 12.0–16.0)
Lymphocytes: 3 %
MCH: 25.8 pg — ABNORMAL LOW (ref 26.0–34.0)
MCHC: 31.7 g/dL — AB (ref 32.0–36.0)
MCV: 82 fL (ref 80–100)
Monocytes: 6 %
Platelet: 166 10*3/uL (ref 150–440)
RBC: 4.25 10*6/uL (ref 3.80–5.20)
RDW: 22.9 % — AB (ref 11.5–14.5)
Segmented Neutrophils: 88 %
WBC: 9.7 10*3/uL (ref 3.6–11.0)

## 2014-10-17 ENCOUNTER — Ambulatory Visit: Payer: Self-pay | Admitting: Internal Medicine

## 2014-10-17 LAB — BASIC METABOLIC PANEL
Anion Gap: 7 (ref 7–16)
BUN: 46 mg/dL — ABNORMAL HIGH (ref 7–18)
CALCIUM: 8.7 mg/dL (ref 8.5–10.1)
CHLORIDE: 103 mmol/L (ref 98–107)
CO2: 23 mmol/L (ref 21–32)
Creatinine: 1.22 mg/dL (ref 0.60–1.30)
EGFR (African American): 57 — ABNORMAL LOW
EGFR (Non-African Amer.): 47 — ABNORMAL LOW
Glucose: 303 mg/dL — ABNORMAL HIGH (ref 65–99)
OSMOLALITY: 290 (ref 275–301)
Potassium: 5.1 mmol/L (ref 3.5–5.1)
Sodium: 133 mmol/L — ABNORMAL LOW (ref 136–145)

## 2014-10-17 LAB — COMPREHENSIVE METABOLIC PANEL
ALK PHOS: 857 U/L — AB
ALT: 241 U/L — AB
AST: 248 U/L — AB (ref 15–37)
Albumin: 2.2 g/dL — ABNORMAL LOW (ref 3.4–5.0)
Anion Gap: 10 (ref 7–16)
BUN: 45 mg/dL — ABNORMAL HIGH (ref 7–18)
Bilirubin,Total: 14.2 mg/dL — ABNORMAL HIGH (ref 0.2–1.0)
CALCIUM: 8.6 mg/dL (ref 8.5–10.1)
CHLORIDE: 104 mmol/L (ref 98–107)
CO2: 22 mmol/L (ref 21–32)
Creatinine: 1.29 mg/dL (ref 0.60–1.30)
EGFR (Non-African Amer.): 44 — ABNORMAL LOW
GFR CALC AF AMER: 53 — AB
Glucose: 296 mg/dL — ABNORMAL HIGH (ref 65–99)
Osmolality: 294 (ref 275–301)
Potassium: 5 mmol/L (ref 3.5–5.1)
Sodium: 136 mmol/L (ref 136–145)
Total Protein: 4.8 g/dL — ABNORMAL LOW (ref 6.4–8.2)

## 2014-10-17 LAB — CBC WITH DIFFERENTIAL/PLATELET
BANDS NEUTROPHIL: 2 %
HCT: 32 % — ABNORMAL LOW (ref 35.0–47.0)
HCT: 34.6 % — ABNORMAL LOW (ref 35.0–47.0)
HGB: 10.1 g/dL — ABNORMAL LOW (ref 12.0–16.0)
HGB: 10.9 g/dL — AB (ref 12.0–16.0)
Lymphocytes: 3 %
Lymphocytes: 1 %
MCH: 25.8 pg — AB (ref 26.0–34.0)
MCH: 26.1 pg (ref 26.0–34.0)
MCHC: 31.5 g/dL — ABNORMAL LOW (ref 32.0–36.0)
MCHC: 31.6 g/dL — ABNORMAL LOW (ref 32.0–36.0)
MCV: 82 fL (ref 80–100)
MCV: 83 fL (ref 80–100)
MONOS PCT: 1 %
Monocytes: 4 %
PLATELETS: 123 10*3/uL — AB (ref 150–440)
PLATELETS: 157 10*3/uL (ref 150–440)
RBC: 3.91 10*6/uL (ref 3.80–5.20)
RBC: 4.19 10*6/uL (ref 3.80–5.20)
RDW: 23.4 % — AB (ref 11.5–14.5)
RDW: 23.4 % — ABNORMAL HIGH (ref 11.5–14.5)
SEGMENTED NEUTROPHILS: 96 %
Segmented Neutrophils: 93 %
WBC: 5.7 10*3/uL (ref 3.6–11.0)
WBC: 7.5 10*3/uL (ref 3.6–11.0)

## 2014-10-17 LAB — URIC ACID: Uric Acid: 5.3 mg/dL (ref 2.6–6.0)

## 2014-10-17 LAB — BILIRUBIN, DIRECT: BILIRUBIN DIRECT: 10.9 mg/dL — AB (ref 0.0–0.2)

## 2014-10-18 LAB — COMPREHENSIVE METABOLIC PANEL
Albumin: 2.3 g/dL — ABNORMAL LOW (ref 3.4–5.0)
Alkaline Phosphatase: 898 U/L — ABNORMAL HIGH
Anion Gap: 8 (ref 7–16)
BUN: 50 mg/dL — ABNORMAL HIGH (ref 7–18)
Bilirubin,Total: 15.1 mg/dL — ABNORMAL HIGH (ref 0.2–1.0)
Calcium, Total: 8.5 mg/dL (ref 8.5–10.1)
Chloride: 103 mmol/L (ref 98–107)
Co2: 23 mmol/L (ref 21–32)
Creatinine: 1.24 mg/dL (ref 0.60–1.30)
EGFR (African American): 56 — ABNORMAL LOW
EGFR (Non-African Amer.): 46 — ABNORMAL LOW
Glucose: 325 mg/dL — ABNORMAL HIGH (ref 65–99)
Osmolality: 294 (ref 275–301)
Potassium: 5.2 mmol/L — ABNORMAL HIGH (ref 3.5–5.1)
SGOT(AST): 244 U/L — ABNORMAL HIGH (ref 15–37)
SGPT (ALT): 249 U/L — ABNORMAL HIGH
Sodium: 134 mmol/L — ABNORMAL LOW (ref 136–145)
Total Protein: 5 g/dL — ABNORMAL LOW (ref 6.4–8.2)

## 2014-10-18 LAB — CBC WITH DIFFERENTIAL/PLATELET
HCT: 36.5 % (ref 35.0–47.0)
HGB: 11.5 g/dL — ABNORMAL LOW (ref 12.0–16.0)
Lymphocytes: 1 %
MCH: 26 pg (ref 26.0–34.0)
MCHC: 31.6 g/dL — ABNORMAL LOW (ref 32.0–36.0)
MCV: 82 fL (ref 80–100)
Platelet: 176 10*3/uL (ref 150–440)
RBC: 4.44 10*6/uL (ref 3.80–5.20)
RDW: 24.5 % — ABNORMAL HIGH (ref 11.5–14.5)
Segmented Neutrophils: 99 %
WBC: 8.2 10*3/uL (ref 3.6–11.0)

## 2014-10-19 LAB — HEPATIC FUNCTION PANEL A (ARMC)
ALBUMIN: 2.1 g/dL — AB (ref 3.4–5.0)
Alkaline Phosphatase: 907 U/L — ABNORMAL HIGH
BILIRUBIN TOTAL: 16.7 mg/dL — AB (ref 0.2–1.0)
Bilirubin, Direct: 12.9 mg/dL — ABNORMAL HIGH (ref 0.0–0.2)
SGOT(AST): 256 U/L — ABNORMAL HIGH (ref 15–37)
SGPT (ALT): 254 U/L — ABNORMAL HIGH
Total Protein: 4.7 g/dL — ABNORMAL LOW (ref 6.4–8.2)

## 2014-10-19 LAB — CBC WITH DIFFERENTIAL/PLATELET
Bands: 4 %
HCT: 34 % — AB (ref 35.0–47.0)
HGB: 10.7 g/dL — AB (ref 12.0–16.0)
Lymphocytes: 2 %
MCH: 25.9 pg — AB (ref 26.0–34.0)
MCHC: 31.5 g/dL — AB (ref 32.0–36.0)
MCV: 82 fL (ref 80–100)
Platelet: 113 10*3/uL — ABNORMAL LOW (ref 150–440)
RBC: 4.15 10*6/uL (ref 3.80–5.20)
RDW: 23.8 % — ABNORMAL HIGH (ref 11.5–14.5)
Segmented Neutrophils: 94 %
WBC: 6.3 10*3/uL (ref 3.6–11.0)

## 2014-10-19 LAB — BASIC METABOLIC PANEL
ANION GAP: 9 (ref 7–16)
BUN: 50 mg/dL — AB (ref 7–18)
Calcium, Total: 8.3 mg/dL — ABNORMAL LOW (ref 8.5–10.1)
Chloride: 105 mmol/L (ref 98–107)
Co2: 22 mmol/L (ref 21–32)
Creatinine: 1.12 mg/dL (ref 0.60–1.30)
EGFR (Non-African Amer.): 52 — ABNORMAL LOW
GLUCOSE: 258 mg/dL — AB (ref 65–99)
OSMOLALITY: 294 (ref 275–301)
Potassium: 5.5 mmol/L — ABNORMAL HIGH (ref 3.5–5.1)
Sodium: 136 mmol/L (ref 136–145)

## 2014-10-19 LAB — URIC ACID: URIC ACID: 7.5 mg/dL — AB (ref 2.6–6.0)

## 2014-10-20 LAB — CBC WITH DIFFERENTIAL/PLATELET
HCT: 28.3 % — ABNORMAL LOW (ref 35.0–47.0)
HGB: 9.1 g/dL — ABNORMAL LOW (ref 12.0–16.0)
Lymphocytes: 2 %
MCH: 25.9 pg — ABNORMAL LOW (ref 26.0–34.0)
MCHC: 32 g/dL (ref 32.0–36.0)
MCV: 81 fL (ref 80–100)
PLATELETS: 69 10*3/uL — AB (ref 150–440)
RBC: 3.5 10*6/uL — AB (ref 3.80–5.20)
RDW: 24.9 % — AB (ref 11.5–14.5)
Segmented Neutrophils: 98 %
WBC: 3.8 10*3/uL (ref 3.6–11.0)

## 2014-10-20 LAB — COMPREHENSIVE METABOLIC PANEL
ALBUMIN: 1.8 g/dL — AB (ref 3.4–5.0)
ANION GAP: 9 (ref 7–16)
Alkaline Phosphatase: 860 U/L — ABNORMAL HIGH
BILIRUBIN TOTAL: 17 mg/dL — AB (ref 0.2–1.0)
BUN: 45 mg/dL — AB (ref 7–18)
CREATININE: 0.86 mg/dL (ref 0.60–1.30)
Calcium, Total: 7.7 mg/dL — ABNORMAL LOW (ref 8.5–10.1)
Chloride: 109 mmol/L — ABNORMAL HIGH (ref 98–107)
Co2: 22 mmol/L (ref 21–32)
EGFR (Non-African Amer.): >60
Glucose: 285 mg/dL — ABNORMAL HIGH (ref 65–99)
Osmolality: 301 (ref 275–301)
POTASSIUM: 4.6 mmol/L (ref 3.5–5.1)
SGOT(AST): 242 U/L — ABNORMAL HIGH (ref 15–37)
SGPT (ALT): 239 U/L — ABNORMAL HIGH
Sodium: 140 mmol/L (ref 136–145)
TOTAL PROTEIN: 4.2 g/dL — AB (ref 6.4–8.2)

## 2014-10-20 LAB — PROTIME-INR
INR: 1.5
PROTHROMBIN TIME: 17.6 s — AB (ref 11.5–14.7)

## 2014-10-21 LAB — CBC WITH DIFFERENTIAL/PLATELET
HCT: 28.4 % — ABNORMAL LOW (ref 35.0–47.0)
HGB: 9.2 g/dL — ABNORMAL LOW (ref 12.0–16.0)
Lymphocytes: 10 %
MCH: 25.9 pg — ABNORMAL LOW (ref 26.0–34.0)
MCHC: 32.4 g/dL (ref 32.0–36.0)
MCV: 80 fL (ref 80–100)
MONOS PCT: 3 %
Platelet: 74 10*3/uL — ABNORMAL LOW (ref 150–440)
RBC: 3.56 10*6/uL — ABNORMAL LOW (ref 3.80–5.20)
RDW: 24.6 % — ABNORMAL HIGH (ref 11.5–14.5)
SEGMENTED NEUTROPHILS: 87 %
WBC: 2.8 10*3/uL — ABNORMAL LOW (ref 3.6–11.0)

## 2014-10-21 LAB — COMPREHENSIVE METABOLIC PANEL
ALK PHOS: 984 U/L — AB
ALT: 255 U/L — AB
Albumin: 1.9 g/dL — ABNORMAL LOW (ref 3.4–5.0)
Anion Gap: 7 (ref 7–16)
BILIRUBIN TOTAL: 17.6 mg/dL — AB (ref 0.2–1.0)
BUN: 38 mg/dL — ABNORMAL HIGH (ref 7–18)
CREATININE: 0.7 mg/dL (ref 0.60–1.30)
Calcium, Total: 7.9 mg/dL — ABNORMAL LOW (ref 8.5–10.1)
Chloride: 110 mmol/L — ABNORMAL HIGH (ref 98–107)
Co2: 23 mmol/L (ref 21–32)
EGFR (African American): >60
Glucose: 199 mg/dL — ABNORMAL HIGH (ref 65–99)
Osmolality: 294 (ref 275–301)
Potassium: 4.3 mmol/L (ref 3.5–5.1)
SGOT(AST): 283 U/L — ABNORMAL HIGH (ref 15–37)
Sodium: 140 mmol/L (ref 136–145)
TOTAL PROTEIN: 4.2 g/dL — AB (ref 6.4–8.2)

## 2014-11-17 ENCOUNTER — Ambulatory Visit: Payer: Self-pay | Admitting: Internal Medicine

## 2014-11-17 DEATH — deceased

## 2015-02-06 NOTE — Op Note (Signed)
PATIENT NAME:  Danielle Frank, Danielle T MR#:  [id number] DATE OF BIRTH:  1951/01/11  DATE OF PROCEDURE:  08/20/2013  PREOPERATIVE DIAGNOSIS: Visually significant cataract of the right eye.   POSTOPERATIVE DIAGNOSIS: Visually significant cataract of the right eye.   OPERATIVE PROCEDURE: Cataract extraction by phacoemulsification with implant of intraocular lens to right eye.   SURGEON: Galen ManilaWilliam Gevon Markus, MD.   ANESTHESIA:  1. Managed anesthesia care.  2. Topical tetracaine drops followed by 2% Xylocaine jelly applied in the preoperative holding area.   COMPLICATIONS: None.   TECHNIQUE:  Stop and chop.  DESCRIPTION OF PROCEDURE: The patient was examined and consented in the preoperative holding area where the aforementioned topical anesthesia was applied to the right eye and then brought back to the Operating Room where the right eye was prepped and draped in the usual sterile ophthalmic fashion and a lid speculum was placed. A paracentesis was created with the side port blade and the anterior chamber was filled with viscoelastic. A near clear corneal incision was performed with the steel keratome. A continuous curvilinear capsulorrhexis was performed with a cystotome followed by the capsulorrhexis forceps. Hydrodissection and hydrodelineation were carried out with BSS on a blunt cannula. The lens was removed in a stop and chop technique and the remaining cortical material was removed with the irrigation-aspiration handpiece. The capsular bag was inflated with viscoelastic and the Tecnis ZCB00 20.0-diopter lens, serial number [id number] was placed in the capsular bag without complication. The remaining viscoelastic was removed from the eye with the irrigation-aspiration handpiece. The wounds were hydrated. The anterior chamber was flushed with Miostat and the eye was inflated to physiologic pressure. 0.1 mL of cefuroxime concentration 10 mg/mL was placed in the anterior chamber. The wounds were found to be  water tight. The eye was dressed with Vigamox. The patient was given protective glasses to wear throughout the day and a shield with which to sleep tonight. The patient was also given drops with which to begin a drop regimen today and will follow-up with me in one day.     ____________________________ Jerilee FieldWilliam L. Savian Mazon, MD wlp:dmm D: 08/20/2013 21:23:15 ET T: 08/20/2013 21:51:26 ET JOB#: [id number]  cc: Elvin Banker L. Davaughn Hillyard, MD, <Dictator> Jerilee FieldWILLIAM L Jaycee Pelzer MD ELECTRONICALLY SIGNED 08/23/2013 11:26

## 2015-02-06 NOTE — Op Note (Signed)
PATIENT NAME:  Danielle Frank, Danielle Frank MR#:  [id number] DATE OF BIRTH:  Mar 21, 1951  DATE OF PROCEDURE:  06/25/2013  PREOPERATIVE DIAGNOSIS: Visually significant cataract of the left eye.   POSTOPERATIVE DIAGNOSIS: Visually significant cataract of the left  eye.   OPERATIVE PROCEDURE: Cataract extraction by phacoemulsification with implant of intraocular lens to left eye.   SURGEON: Galen ManilaWilliam Maicee Ullman, MD.   ANESTHESIA:  1. Managed anesthesia care.  2. Topical tetracaine drops followed by 2% Xylocaine jelly applied in the preoperative holding area.   COMPLICATIONS: None.   TECHNIQUE:  Stop and chop.  DESCRIPTION OF PROCEDURE: The patient was examined and consented in the preoperative holding area where the aforementioned topical anesthesia was applied to the left eye and then brought back to the Operating Room where the left eye was prepped and draped in the usual sterile ophthalmic fashion and a lid speculum was placed. A paracentesis was created with the side port blade and the anterior chamber was filled with viscoelastic. A near clear corneal incision was performed with the steel keratome. A continuous curvilinear capsulorrhexis was performed with a cystotome followed by the capsulorrhexis forceps. Hydrodissection and hydrodelineation were carried out with BSS on a blunt cannula. The lens was removed in a stop and chop technique and the remaining cortical material was removed with the irrigation-aspiration handpiece. The capsular bag was inflated with viscoelastic and the Tecnis ZCB00 20.5-diopter lens, serial number [id number], was placed in the capsular bag without complication. The remaining viscoelastic was removed from the eye with the irrigation-aspiration handpiece. The wounds were hydrated. The anterior chamber was flushed with Miostat and the eye was inflated to physiologic pressure. 0.1 mL of cefuroxime concentration 10 mg/mL was placed in the anterior chamber. The wounds were found to be water  tight. The eye was dressed with Vigamox. The patient was given protective glasses to wear throughout the day and a shield with which to sleep tonight. The patient was also given drops with which to begin a drop regimen today and will follow-up with me in one day.    ____________________________ Jerilee FieldWilliam L. Jamarco Zaldivar, MD wlp:cb D: 06/25/2013 17:37:51 ET Frank: 06/25/2013 20:12:03 ET JOB#: [id number]  cc: Carlyn Lemke L. Alleyne Lac, MD, <Dictator> Jerilee FieldWILLIAM L Jacob Chamblee MD ELECTRONICALLY SIGNED 07/08/2013 16:55

## 2015-02-06 NOTE — Discharge Summary (Signed)
PATIENT NAME:  Danielle Frank, Danielle Frank MR#:  [id number] DATE OF BIRTH:  [birth year]  DATE OF ADMISSION:  01/07/2013 DATE OF DISCHARGE:  01/11/2013  DISCHARGE DIAGNOSES: 1.  Chronic obstructive pulmonary disease.  2.  Bronchitis with Streptococcus pneumoniae.  3.  Nausea and vomiting with dehydration.  4.  Type 2 diabetes.  5.  Peripheral neuropathy.   CHIEF COMPLAINT: Vomiting, diarrhea, shortness of breath and cough with productive sputum.   HISTORY OF PRESENT ILLNESS: Danielle Frank is a 64 year old female with history of advanced COPD, type 2 diabetes, osteoarthritis and hyperlipidemia who presented to the clinic complaining of vomiting that started about 3 days prior to admission. The patient also had been having diarrhea, about 2 to 3 episodes, associated with fever, chills, headache, shortness of breath and cough with production of bloodstained sputum. The patient also had been experiencing some lower chest tightness, mainly in the anterior aspect, and the sugars were running high, as high as 250.   PAST MEDICAL HISTORY:  1.  COPD on home oxygen. 2.  Type 2 diabetes.  3.  Chronic back pain. 4.  Colonic polyps. 5.  Migraine headaches. 6.  Sleep apnea.  7.  Hyperlipidemia.   PAST SURGICAL HISTORY: 1.  Bilateral tubal ligation.  2.  Carpal tunnel surgery, bilateral. 3.  Excision of thyroid nodule.  4.  Laparoscopic cholecystectomy.   HOSPITAL COURSE:  On physical examination, she appeared to be in distress. Weight was 191 pounds, blood pressure 112/60 and temp was 100.9. She was tachycardic. Heart rate was 138 and O2 sat 91% on 2 liters nasal cannula. HEENT NCAT, voice was hoarse, mucous membranes were dry. Heart S1, S2 tachycardic. Lungs with bilateral inspiratory and expiratory wheezes. Abdomen was soft and nontender.  Extremities had no edema. Neurologically nonfocal.  Labs:  WBC count 17.3, hemoglobin 13.5 and platelets 138.  The patient did have evidence for urinary tract infection,  although cultures were negative. The patient's sputum cultures grew Streptococcus pneumoniae in addition to Haemophilus influenzae and Staphylococcus aureus. She was treated with intravenous Rocephin. She was also given IV fluids. Her white cell count returned to normal.   The patient was also seen by physical therapy. Her shortness of breath also improved. She was maintained on her Lantus insulin, sliding scale. Her sugars were reasonably well controlled while she was in the hospital and overall the patient was stable at time of discharge.   DISCHARGE MEDICATIONS:  The patient was discharged home on the following: 1.  Metformin 1000 mg b.i.d.  2.  Glyburide 5 mg p.o. b.i.d.  3.  Lantus insulin 90 units sub-Q daily.  4.  Omeprazole 40 mg once a day.  5.  Gabapentin 100 mg t.i.d.  6.  Spiriva HandiHaler 1 capsule once a day.  7.  Humalog insulin as per sliding scale insulin.  8.  Ventolin inhaler 2 puffs q. 4 hours p.r.n.  9.  Advair Diskus 250/50 one puff b.i.d.  10.  Metoprolol succinate extended-release 50 mg once a day. 11.  Ceftin 250 mg p.o. b.i.d. for 10 days.   DISCHARGE INSTRUCTIONS:  The patient has been advised to follow up with me, Dr. Marcello Fennel, in 1 to 2 weeks. She was also advised to continue her oxygen at 2 liters nasal cannula.   Total time spent in discharge of this patient: 35 minutes ____________________________ Barbette Reichmann, MD vh:sb D: 01/11/2013 08:30:06 ET    T: 01/11/2013 09:08:59 ET        JOB#: [id number] cc: Kaelum Kissick,  MD, <Dictator> Barbette ReichmannVISHWANATH Aspen Lawrance MD ELECTRONICALLY SIGNED 01/27/2013 13:44

## 2015-02-06 NOTE — H&P (Signed)
PATIENT NAME:  Danielle Frank, Danielle Frank MR#:  [id number] DATE OF BIRTH:  [birth year]  DATE OF ADMISSION:  01/07/2013  CHIEF COMPLAINT: Vomiting, diarrhea, fever and shortness of breath.   HISTORY OF PRESENT ILLNESS: Belma Dyches is a 64 year old female with a history of advanced COPD, type 2 diabetes, osteoarthritis and hyperlipidemia who presented to the clinic complaining of vomiting that started about 3 days back. The patient also states that she had 2 or 3 episodes of diarrhea associated with fever, chills, headache, shortness of breath, and cough with productive blood stained sputum. She has also been experiencing some lower chest tightness mainly in the anterior aspect. Her sugars been running high, sometimes as high as 250. The patient denies any dysuria, but states that her urine output has not been good, her mouth is dry and she feels extremely weak.   PAST MEDICAL HISTORY: Significant for COPD, on oxygen, type 2 diabetes, history of chronic back pain, history of colonic polyps, migraine headaches, sleep apnea and hyperlipidemia.   PAST SURGICAL HISTORY:  Significant for bilateral tubal ligation, carpal tunnel surgery, bilateral excision of thyroid nodule, laparoscopic cholecystectomy, abnormal stress test and a cardiac cath done in 2007 was normal.   CURRENT MEDICATIONS: 1.  Advair Diskus 250/50 puff b.i.d.  2.  Afrin nasal spray 2 sprays b.i.d.  3.  Famotidine 1 tablet once a day. 4.  Zyrtec 10 mg once a day. 5.  Aspirin 81 mg once a day.  6.  Spiriva HandiHaler 1 capsule once a day. 7.  Tylenol 2 tablets p.r.n. 8.  Oxygen 2 liters nasal cannula.  9.  Metoprolol 50 mg a day. 10.  Singulair 10 mg a day. 11.  Metformin 1000 mg b.i.d.  12.  Gabapentin 100 mg t.i.d.  13.  Theophylline 200 mg every 12 hours.  14.  ProAir HFA 2 puffs q.i.d. p.r.n.  15.  Albuterol nebs q. 6 hours p.r.n.  16.  Glyburide 5 mg once a day. 17.  Pravastatin 80 mg once a day. 18.  Lantus insulin 90 units at  bedtime.  ALLERGIES: CODEINE SULFATE, SULFA, PERCOCET, DARVOCET, SEPTRA, DARVON AND PERCODAN.   FAMILY HISTORY: Positive for cancer in her dad and grandfather.   PHYSICAL EXAMINATION: GENERAL: She appeared to be in moderate distress.  VITAL SIGNS: Weight 191 pounds, blood pressure 112/60, temperature 100.9, pulse tachycardic with heart rate 138 and 02 sats 91% on 2 liters nasal cannula  HEENT: Normocephalic, atraumatic. No JVD. Mucous membranes were dry.  HEART:  S1 and S2. LUNGS: Rhonchi present bilaterally.  ABDOMEN: Soft. Mild epigastric tenderness present.  EXTREMITIES: No edema.  NEUROLOGIC: Alert and oriented x 3. No obvious focal signs. Labs done today showed WCC of 17.3, Hgb of 13.5 and Platelets of 138  and ec=vidence for UTI  IMPRESSION:  Vomiting and diarrhea associated with fever, tachycardia and dehydration and evidence for UTI  PLAN:  1.  Advised admission to [facility] for intravenous fluids. We will also obtain blood cultures x 2 and chest x-ray.Send urine for C&S We will start to IV antibiotics with Rocephin 1 gram every 24 hours for presumed UTI and Bronchitis  Check cpk and cardiac enzymes  2.  Chronic obstructive pulmonary disease. Continue Advair Diskus, Spiriva HandiHaler,  oxygen and nebulized bronchodilators p.r.n.  3.  Type 2 diabetes. Continue Lantus insulin at bedtime, metformin 1000 b.i.d. and glyburide 5 mg b.i.d. Continue to monitor her blood sugars before meals and at bedtime. We will continue her Singulair and metoprolol.  I  discussed the plan of care with the patient and family who were in agreement. ____________________________ Barbette ReichmannVishwanath Kyilee Gregg, MD vh:sb D: 01/07/2013 12:36:22 ET T: 01/07/2013 13:08:30 ET JOB#: [id number]  cc: Barbette ReichmannVishwanath Talley Kreiser, MD, <Dictator> Barbette ReichmannVISHWANATH Ladale Sherburn MD ELECTRONICALLY SIGNED 01/27/2013 13:40

## 2015-02-07 NOTE — Consult Note (Signed)
Pt with 4 weeks of climbing LFT's and a CT showing a lung mass and peri hepatic and periportal lymph nodes.  This is likely metastatic disease to liver.  No role for biopsy at this time.    Electronic Signatures: Scot JunElliott, Amato Sevillano T (MD)  (Signed on 26-Dec-15 14:21)  Authored  Last Updated: 26-Dec-15 14:21 by Scot JunElliott, Delesia Martinek T (MD)

## 2015-02-07 NOTE — Consult Note (Signed)
Chief Complaint:  Subjective/Chief Complaint The patient has a positive PET scan with stage 4 lung cancer. There were no signs of lvier mets but there were positive abd lymph nodes.   VITAL SIGNS/ANCILLARY NOTES: **Vital Signs.:   23-Dec-15 06:02  Vital Signs Type Routine  Temperature Temperature (F) 98.1  Celsius 36.7  Temperature Source oral  Pulse Pulse 93  Respirations Respirations 20  Systolic BP Systolic BP 168  Diastolic BP (mmHg) Diastolic BP (mmHg) 66  Mean BP 80  Pulse Ox % Pulse Ox % 92  Pulse Ox Activity Level  At rest  Oxygen Delivery Room Air/ 21 %   Brief Assessment:  GEN well developed, well nourished, no acute distress   Additional Physical Exam Alert and orientated times 3   Lab Results: Hepatic:  23-Dec-15 04:10   Bilirubin, Total  2.2  Alkaline Phosphatase  481 (46-116 NOTE: New Reference Range 05/06/14)  SGPT (ALT)  192 (14-63 NOTE: New Reference Range 05/06/14)  SGOT (AST)  158  Total Protein, Serum  5.5  Albumin, Serum  2.4  Routine Chem:  23-Dec-15 04:10   Result Comment platelet - RESULTS VERIFIED BY REPEAT TESTING.  Result(s) reported on 08 Oct 2014 at 05:26AM.  Glucose, Serum  119  BUN  21  Creatinine (comp) 0.93  Sodium, Serum 139  Potassium, Serum 4.2  Chloride, Serum  109  CO2, Serum 24  Calcium (Total), Serum 9.8  Osmolality (calc) 282  eGFR (African American) >60  eGFR (Non-African American) >60 (eGFR values <7m/min/1.73 m2 may be an indication of chronic kidney disease (CKD). Calculated eGFR, using the MRDR Study equation, is useful in  patients with stable renal function. The eGFR calculation will not be reliable in acutely ill patients when serum creatinine is changing rapidly. It is not useful in patients on dialysis. The eGFR calculation may not be applicable to patients at the low and high extremes of body sizes, pregnant women, and vegetarians.)  Anion Gap  6  Routine Hem:  23-Dec-15 04:10   WBC (CBC) 9.9  RBC  (CBC) 4.50  Hemoglobin (CBC)  11.2  Hematocrit (CBC) 36.0  Platelet Count (CBC)  90  MCV 80  MCH  24.8  MCHC  31.0  RDW  19.6  Neutrophil % 78.1  Lymphocyte % 14.2  Monocyte % 6.9  Eosinophil % 0.3  Basophil % 0.5  Neutrophil #  7.8  Lymphocyte # 1.4  Monocyte # 0.7  Eosinophil # 0.0  Basophil # 0.0   Assessment/Plan:  Assessment/Plan:  Assessment Increased liver enzymes in a patient with a positive PET scan for possible stage 4 lung Ca. Liver enzymes going down.   Plan Patient with cirrhosis and comensated liver disease. Nothing to do at this time for the patients cirrhosis. Will sign off now. Please call with any changes in patient liver status.   Electronic Signatures: WLucilla Lame(MD)  (Signed 23-Dec-15 12:39)  Authored: Chief Complaint, VITAL SIGNS/ANCILLARY NOTES, Brief Assessment, Lab Results, Assessment/Plan   Last Updated: 23-Dec-15 12:39 by WLucilla Lame(MD)

## 2015-02-07 NOTE — H&P (Signed)
PATIENT NAME:  Danielle Frank, Danielle Frank MR#:  [id number] DATE OF BIRTH:  [birth year]  DATE OF ADMISSION:  09/20/2014  REFERRING PHYSICIAN: Kathreen DevoidKevin A. Paduchowski, MD  PRIMARY CARE PHYSICIAN: Hillis RangeVish Hande, M.D, [facility].   CHIEF COMPLAINT: Nausea, vomiting.   HISTORY OF PRESENT ILLNESS: A 64 year old Caucasian female with a history of COPD and chronic respiratory failure at 2 L nasal cannula at baseline, type 2 diabetes insulin-requiring; however, uncomplicated, presenting with nausea and vomiting. Approximately 2 weeks ago diagnosed with pneumonia and placed on Levaquin. She had actually finished her course of Levaquin just yesterday and despite that she has had some nausea and vomiting with Levaquin to the point of the  last 3 days the nausea and vomiting are nonbloody, nonbilious emesis. States that her nausea was unremitting. Called her PCP. Given Zofran and despite this symptoms worsened. Thus, presented to the hospital for further workup and evaluation. Of note, she does state she has chronic diarrhea which is unchanged. No further symptoms. Does complain of mild abdominal pain, crampy in sensation, epigastric in location, 4 out of 10 in intensity, nonradiating, No worsening or relieving factors. Has poor p.o. intake for the last 3 days' duration, as well. She does describe dyspnea on exertion which is slightly worse than baseline with a cough occasionally productive of whitish sputum. However, denies any fevers, chills. Does describe having scant wheezing, as well.   REVIEW OF SYSTEMS: CONSTITUTIONAL: Denies any fevers, chills. Positive for fatigue, weakness.  EYES: Denies blurred vision, double vision, or eye pain.  EARS, NOSE, THROAT: Denies tinnitus, ear pain or hearing loss.  RESPIRATORY: Positive for cough, wheeze, shortness of breath as described above.  CARDIOVASCULAR: Denies any chest pain, palpitations, or edema.  GASTROINTESTINAL: Positive for  nausea, vomiting, diarrhea. Denies any  abdominal pain. GENITOURINARY: No dysuria, hematuria.  ENDOCRINE: Denies nocturia or thyroid problems.  HEMATOLOGIC AND LYMPHATIC: Denies easy bruising or bleeding.  SKIN: Denies rashes or lesions.  MUSCULOSKELETAL: Denies pain in neck, back, shoulder, knees, hips, or arthritic symptoms.  NEUROLOGIC: Denies paralysis or paresthesias.  PSYCHIATRIC: Denies any anxiety or depressive symptoms. Otherwise, full review of systems performed by me is negative.   PAST MEDICAL HISTORY: Significant for COPD with chronic respiratory failure 2 liters nasal cannula at baseline, type 2 diabetes insulin-requiring; however, uncomplicated, obstructive sleep apnea noncompliant with CPAP therapy, hyperlipidemia unspecified.   SOCIAL HISTORY: Remote tobacco use. Denies any alcohol or drug use.   FAMILY HISTORY: Positive for cancer; however, she is uncertain of the type. Otherwise, denies any known cardiovascular or pulmonary disorders.   ALLERGIES: CODEINE, PERCOCET, SULFA DRUGS.   HOME MEDICATIONS: Include prednisone 10 mg p.o. daily, acetaminophen 500 mg p.o. q.6 h. as needed for pain, meloxicam 7.5 mg p.o. daily, losartan 25 mg p.o. daily, gabapentin 100 mg p.o. 3 times daily, canagliflozin 100 mg p.o. daily, glipizide extended release 5 mg p.o. daily, Humalog sliding scale, Lantus 90 units subcutaneous at bedtime, metformin 1000 mg p.o. b.i.d., loperamide 2 mg 2 tabs p.o. as needed for diarrhea, fluconazole 100 mg p.o. daily, cetirizine 10 mg p.o. daily, benzoate 100 mg p.o. as needed for cough, metoprolol extended release 50 mg p.o. daily, Advair 250/50 mcg inhalation 1 puff b.i.d., Proventil every 6 hours as needed for wheezing, ipratropium 18 mcg inhalation daily, clotrimazole topical 1% to affected area b.i.d., promethazine 600 mg extended release as needed for cough, Singulair 10 mg p.o. daily, Nasonex 50 mcg inhalation 2 sprays daily, oxymetazoline nasal spray 0.05% 2 sprays b.i.d., Prilosec 40  mg p.o. daily,  roflumilast 500 mcg p.o. daily, bupropion 150 mg p.o. daily, Biotin 250 mg daily, cyanocobalamin 1000 mcg daily, ergocalciferol 50,000 international units p.o. weekly, omega-3 fish oil 340/1000 mg. p.o. b.i.d., probiotic capsule daily.   PHYSICAL EXAMINATION:  VITAL SIGNS: Temperature 97, heart rate 82, respirations 22, blood pressure 140/81, saturating 97% on 2 liters cannula. Weight 85 kg, BMI 32.2.  GENERAL: Chronically ill-appearing Caucasian female, currently in no acute distress.  HEAD: Normocephalic, atraumatic. Pupils equal, round, reactive to light. Extraocular muscles intact. No scleral icterus.  MOUTH: Moist mucosal membranes. Dentition intact. No abscess noted.  EARS, NOSE, THROAT: Clear without exudates. No external lesions.  NECK: Supple. No thyromegaly. No nodules. No JVD. PULMONARY: Diminished breath sounds throughout all lung fields; however, no frank wheezes, rales, or rhonchi. No use of accessory muscles. Good respiratory effort.  CHEST: Nontender to palpation.  CARDIOVASCULAR: S1 and S2. Regular rate and rhythm. No murmurs, rubs, gallops. No edema. Pedal pulses 2+ bilaterally.  GASTROINTESTINAL: Soft, nontender, nondistended. No masses. Positive bowel sounds. No hepatosplenomegaly.  MUSCULOSKELETAL: No swelling, clubbing, or edema. Range of motion full in all extremities.  NEUROLOGIC: Cranial nerves II through XII intact. No gross focal neurological deficits. Sensation intact. Reflexes intact.  SKIN: No ulceration, lesions, rashes, or cyanosis. Skin warm, dry. Turgor intact.  PSYCHIATRIC: Mood and affect within normal limits. The patient is awake, alert, oriented x 3. Insight and judgment intact.   LABORATORY DATA: Chest x-ray performed reveals mild patchy left lower lobe opacity, atelectasis versus pneumonia. The remainder of laboratory data: Sodium 121, potassium 4.5, chloride 90, bicarbonate 22, BUN 10, creatinine 0.76, glucose 197. LFTs: Bilirubin of 1.1, alkaline  phosphatase of 114, AST 62, ALT 60. Troponin less than 0.02. WBC is 7.9, hemoglobin 12.1, platelets of 241,000. Urinalysis negative for evidence of infection.   ASSESSMENT AND PLAN: A 64 year old Caucasian female with history of chronic obstructive pulmonary disease, chronic respiratory failure on 2 liters nasal cannula at baseline, type 2 diabetes insulin-requiring; however, uncomplicated, presenting with nausea and vomiting. 1.  Hyponatremia. She has actually received 2 liters normal saline in the emergency department thus far. Initially she had an 807.5 mEq sodium deficit to correct her sodium of 121 to 140; However, she corrected to 141. She had a 425 mEq sodium deficit. Once again, she has already received 2 liters normal saline in the Emergency Department. Thus, intravenous fluid hydration with normal saline 30 mL/h. Follow sodium levels. Adjust rate accordingly. She appears to be euvolemic at this time. Thus, we will check a TSH, as well as urine sodium and urine osmolalities. I would not be surprised if syndrome of inappropriate secretion of antidiuretic hormone is a likely etiology given her recurrent pulmonary disorders. If her urine osmolalities are elevated, will need fluid restrictions and at that time we can discontinue further intravenous fluid hydration.  2.  Chronic obstructive pulmonary disease exacerbation. Somewhat worse than baseline. Provide steroids, Solu-Medrol 60 mg IV daily, DuoNeb treatments q.4 h.  supplemental 02 to keep oxygen saturation greater than 88%. Continue with her home medications including Advair, Spiriva, roflumilast, and Singulair.  3.  Type 2 diabetes, insulin-requiring. Hold p.o. agents. Add insulin to scale with q.6 hour Accu-Cheks. Continue her Lantus; however, decrease the dose for now given poor p.o. intake and adjust accordingly.  4.  Gastroesophageal reflux disease. Proton pump inhibitor therapy.  5.  Venous thromboembolism prophylaxis. Heparin subcutaneous.    Patient is full code.   TIME SPENT: 45 minutes.   ____________________________  Cletis Athens. Erienne Spelman, MD dkh:am D: 09/20/2014 00:08:03 ET Frank: 09/20/2014 00:43:06 ET JOB#: [id number]  cc: Cletis Athens. Gypsy Kellogg, MD, <Dictator> Nero Sawatzky Synetta Shadow MD ELECTRONICALLY SIGNED 09/20/2014 2:13

## 2015-02-07 NOTE — Consult Note (Signed)
Chief Complaint:  Subjective/Chief Complaint FATIGUE, DENIED PAIN OR SOB   VITAL SIGNS/ANCILLARY NOTES: **Vital Signs.:   31-Dec-15 13:54  Vital Signs Type Routine  Temperature Temperature (F) 97.4  Celsius 36.3  Temperature Source oral  Pulse Pulse 91  Respirations Respirations 20  Systolic BP Systolic BP 93  Diastolic BP (mmHg) Diastolic BP (mmHg) 56  Mean BP 68  Pulse Ox % Pulse Ox % 95  Pulse Ox Activity Level  At rest  Oxygen Delivery 2L    14:26  Vital Signs Type Recheck  Systolic BP Systolic BP 99  Diastolic BP (mmHg) Diastolic BP (mmHg) 60  Mean BP 73   Brief Assessment:  GEN no acute distress   Respiratory normal resp effort   Gastrointestinal details normal Nontender  liver enlarged and abdo distended   Lab Results: Routine Chem:  31-Dec-15 08:48   Glucose, Serum  284  BUN  48  Creatinine (comp)  1.39  Sodium, Serum  133  Potassium, Serum  5.3  Chloride, Serum 100  CO2, Serum 25  Calcium (Total), Serum 9.2  Anion Gap 8  Osmolality (calc) 289  eGFR (African American)  49  eGFR (Non-African American)  40 (eGFR values <60mL/min/1.73 m2 may be an indication of chronic kidney disease (CKD). Calculated eGFR, using the MRDR Study equation, is useful in  patients with stable renal function. The eGFR calculation will not be reliable in acutely ill patients when serum creatinine is changing rapidly. It is not useful in patients on dialysis. The eGFR calculation may not be applicable to patients at the low and high extremes of body sizes, pregnant women, and vegetarians.)  Routine Hem:  31-Dec-15 08:48   WBC (CBC) 9.7  RBC (CBC) 4.25  Hemoglobin (CBC)  11.0  Hematocrit (CBC)  34.6  Platelet Count (CBC) 166 (Result(s) reported on 16 Oct 2014 at 09:16AM.)  MCV 82  MCH  25.8  MCHC  31.7  RDW  22.9  Bands 3  Segmented Neutrophils 88  Lymphocytes 3  Monocytes 6  Diff Comment 1 ANISOCYTOSIS  Diff Comment 2 POIKILOCYTOSIS  Diff Comment 3 MICROCYTES  PRESENT  Diff Comment 4 TARGET CELLS  Diff Comment 5 NORMAL PLT MORPHOLGY  Result(s) reported on 16 Oct 2014 at 09:16AM.   Assessment/Plan:  Assessment/Plan:  Assessment SMALL CELL LUNG CANCER, CHMOTX DAY 2 OF 3 WITH DOSES MODIFIED, AS PER DR PANDITS PRIOR EVALUATION,  REDUCED FOR LIVER DISEASE. . HIGH K, HIGH URIC ACID, CONCERN RE DEVELOPING TUMOR LYSIS.YESTERDAY FLUID INCREADED, STARTED ULORIC, GAVE KAYEXALATE..TODAY K SLIGHTLY LOWER, CRESLIGHTLY HIGER, NO ACUTE DISTRESS, FEELS STRONGER, DIARRHEA LIKELY FROM KAYEXALATE   Plan RECHECK MET B,   Electronic Signatures: Gittin, Robert G (MD)  (Signed 31-Dec-15 18:05)  Authored: Chief Complaint, VITAL SIGNS/ANCILLARY NOTES, Brief Assessment, Lab Results, Assessment/Plan   Last Updated: 31-Dec-15 18:05 by Gittin, Robert G (MD) 

## 2015-02-07 NOTE — Consult Note (Signed)
Discussed rising LFT's with Dr. Graciela HusbandsKlein, metastatic cancer, no obvious spots in liver on PET scan but can possibly have multiple small ones.  Since Rocephin can have effects on liver in some patients it would be reasonable to switch to another antibiotic.  Electronic Signatures: Scot JunElliott, Lauris Serviss T (MD)  (Signed on 25-Dec-15 10:27)  Authored  Last Updated: 25-Dec-15 10:27 by Scot JunElliott, Kyle Luppino T (MD)

## 2015-02-07 NOTE — Consult Note (Signed)
ONCOLOGY followup note - denies new cough or chest pain. Back pain better controlled. No nausea or vomiting.no fevers. Eating fair.A, Ox3, NAD          vitals - afebrile, stable          lungs - b/l good air entry          abd - soft, nontender           ext - no edema Labs: platelets 85000. INR 1.2. mass, mediastinal adenopathy and multiple bone lesions likely metastatic lung cancer. Thrombocytopenia likely from known h/o cirrhosis. No obvious bleeding symptoms. Patient planned for brochoscopy early next week. No major pain issues. If discharged after bronch, please make f/u appt with Dr.Choksi within few days after discharge for further treatment  planning.   Electronic Signatures: Danielle Frank, Verline Kong Raj (MD)  (Signed on 26-Dec-15 17:26)  Authored  Last Updated: 26-Dec-15 17:26 by Danielle Frank, Stephannie Broner Raj (MD)

## 2015-02-07 NOTE — Consult Note (Signed)
Chief Complaint:  Subjective/Chief Complaint FATIGUE, DENIED PAIN OR SOB   VITAL SIGNS/ANCILLARY NOTES: **Vital Signs.:   30-Dec-15 21:32  Vital Signs Type Routine  Temperature Temperature (F) 98.3  Celsius 36.8  Temperature Source oral  Pulse Pulse 92  Respirations Respirations 20  Systolic BP Systolic BP 758  Diastolic BP (mmHg) Diastolic BP (mmHg) 65  Mean BP 80  Pulse Ox % Pulse Ox % 92  Pulse Ox Activity Level  At rest  Oxygen Delivery 2L   Brief Assessment:  GEN critically ill appearing   Respiratory normal resp effort   Gastrointestinal details normal Nontender  GROSS HEPATOMEGALY   Lab Results: Routine Chem:  30-Dec-15 04:20   Glucose, Serum  151  BUN  38  Creatinine (comp) 1.30  Sodium, Serum  134  Potassium, Serum 5.1  Chloride, Serum 101  CO2, Serum 25  Calcium (Total), Serum  10.5  Anion Gap 8  Osmolality (calc) 280  eGFR (African American)  53  eGFR (Non-African American)  44 (eGFR values <58m/min/1.73 m2 may be an indication of chronic kidney disease (CKD). Calculated eGFR, using the MRDR Study equation, is useful in  patients with stable renal function. The eGFR calculation will not be reliable in acutely ill patients when serum creatinine is changing rapidly. It is not useful in patients on dialysis. The eGFR calculation may not be applicable to patients at the low and high extremes of body sizes, pregnant women, and vegetarians.)    19:54   Glucose, Serum  209  BUN  41  Creatinine (comp)  1.32  Sodium, Serum  132  Potassium, Serum  5.7  Chloride, Serum 101  CO2, Serum 25  Calcium (Total), Serum 9.8  Anion Gap  6  Osmolality (calc) 281  eGFR (African American)  52  eGFR (Non-African American)  43 (eGFR values <623mmin/1.73 m2 may be an indication of chronic kidney disease (CKD). Calculated eGFR, using the MRDR Study equation, is useful in  patients with stable renal function. The eGFR calculation will not be reliable in acutely ill  patients when serum creatinine is changing rapidly. It is not useful in patients on dialysis. The eGFR calculation may not be applicable to patients at the low and high extremes of body sizes, pregnant women, and vegetarians.)  Uric Acid, Serum  8.4 (Result(s) reported on 15 Oct 2014 at 08:19PM.)  Routine Hem:  30-Dec-15 04:20   WBC (CBC) 10.7  RBC (CBC) 4.47  Hemoglobin (CBC)  11.3  Hematocrit (CBC) 35.8  Platelet Count (CBC) 173 (Result(s) reported on 15 Oct 2014 at 06:24AM.)  MCV 80  MCH  25.3  MCHC  31.6  RDW  23.0  Segmented Neutrophils 81  Lymphocytes 15  Monocytes 4  Diff Comment 1 ANISOCYTOSIS  Diff Comment 2 POIKILOCYTOSIS  Diff Comment 3 NORMAL PLT MORPHOLGY  Result(s) reported on 15 Oct 2014 at 06:24AM.   Assessment/Plan:  Assessment/Plan:  Assessment SMALL CELL LUNG CANCER, CHMOTX DAY 1 OF 3 WITH DOSES MODIFIED, AS PER DR PANDITS PRIOR EVALUATION,  REDUCED FOR LIVER DISEASE. . Marland KitchenIGH K, HIGH URIC ACID, CONCERN RE DEVELOPIN TUMOR LYSIS   Plan INCREASED IV FLUIDS, STARTED ULORIC,  DOSE OF KAJimmy Picket  Electronic Signatures: GiDallas SchimkeMD)  (Signed 30-Dec-15 22:50)  Authored: Chief Complaint, VITAL SIGNS/ANCILLARY NOTES, Brief Assessment, Lab Results, Assessment/Plan   Last Updated: 30-Dec-15 22:50 by GiDallas SchimkeMD)

## 2015-02-07 NOTE — Consult Note (Signed)
ONCOLOGY followup - still weak. Denies new cough or chest pain. Back pain better controlled. no fevers. No nausea or vomiting. Eating fair.A, Ox3, NAD on [place] O2.          vitals - afebrile, stable          lungs - b/l good air entry, no rhonchi          abd - soft, nontender           ext - no edema  Labs: Cr 1.25, WBC 9700, Hb 11.2, platelets 156  mass, mediastinal adenopathy and multiple bone lesions. Thrombocytopenia likely from known h/o cirrhosis, platelets improved today - bronch/bx reports small cell carcinoma. Patient and er sister present were explained about stage IV small cell lung cancer, that it is incurable and treatments offered are with palliative intent only.  Have explained options inc,uding pursuing palliative  chemo with Carboplatin/VP-16 and discussed overall reaponse rates and possible side effects veraus purasuing supportive care/hospice. Patient wants to pursue chemotherapy. Will start on cycle 1 chhemo tomorrow with Carboplatin AUC 4 on day 1 and Etoposide 50 mg/m2/day on days 1,2,3 (Dec 30, 31 and Jan 1. Lower dose due to abnormal LFTs). Patient also states she will not be able to manage at home and will discuss with  Dr.Hande about placement at SNF. If she is discharged by this weekend, [facility]. Oncology will continue to follow during hospitalisation. Patient agreeable to this plan.   Electronic Signatures: Izola PricePandit, Kyheem Bathgate Raj (MD)  (Signed on 30-Dec-15 00:02)  Authored  Last Updated: 30-Dec-15 00:02 by Izola PricePandit, Anishka Bushard Raj (MD)

## 2015-02-07 NOTE — Consult Note (Signed)
PATIENT NAME:  Danielle FlavorsBRADSHAW, Antonya T MR#:  [id number] DATE OF BIRTH:  [birth year]  DATE OF CONSULTATION:  10/02/2014  PRIMARY CARE PHYSICIAN: Barbette ReichmannVishwanath Hande, MD  PULMONOLOGIST: Clenton PareHerbon E. Meredeth IdeFleming, MD  REFERRING PHYSICIAN:  Sheran FavaMark R. Fanny BienQuale, MD  CONSULTING PHYSICIAN:  Mycah Mcdougall R. Melitza Metheny, MD  REASON FOR CONSULTATION: Pneumonia and transaminitis.   HISTORY OF PRESENT ILLNESS: A 64 year old Caucasian female patient who was recently discharged from the hospital on 09/30/2014 after being treated for left-sided post obstructive pneumonia with concern for a possible bronchogenic cancer and mild transaminitis returns to the Emergency Room complaining of pain in right upper quadrant, nausea, and shakiness. The patient mentions that she felt shaky similar to when her sodium was low, was concerned, and came here. Also, the tramadol that she has been using for right upper quadrant pain is not working. She has had nausea but no vomiting. No diarrhea. The patient is supposed to follow up with Dr. Marcello FennelHande and Dr. Meredeth IdeFleming in 5 days on Tuesday for a possible bronchoscopy and further workup.   The patient had elevated liver enzymes during recent admission. Her bilirubin was 1.5, today it is 2.4. CT scan of the abdomen does show lymph nodes, a possible metastasis.  She is status post cholecystectomy. CBD is normal. No stones found on CT scan.   PAST MEDICAL HISTORY:  1.  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  2.  Type 2 diabetes mellitus.  3.  Obstructive sleep apnea.  4.  Hyperlipidemia.  5.  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  6.  Chronic hyponatremia.   SOCIAL HISTORY: Remote tobacco use. No alcohol. No illicit drug use.   FAMILY HISTORY: Positive for cancer; the patient is uncertain of what kind.   ALLERGIES: TO CODEINE, PERCOCET, SULFA.   REVIEW OF SYSTEMS:  CONSTITUTIONAL: Complains of fatigue, weakness.  EYES: No blurred vision, pain, redness.  EAR, NOSE, THROAT: No tinnitus, ear  pain, hearing loss.  RESPIRATORY: Has chronic cough, chronic shortness of breath which are similar.  CARDIOVASCULAR: No chest pain, orthopnea, edema.  GASTROINTESTINAL: Has some nausea but no vomiting, and right upper quadrant pain.  GENITOURINARY: No dysuria or hematuria.  MUSCULOSKELETAL: No joint swelling, redness, effusion of joints.  SKIN: No petechiae, rash, has some bruising from recent heparin shots. NEUROLOGIC: No focal numbness, weakness, seizure.   HOME MEDICATIONS:  1.  Advair Diskus 250/50 inhaled 2 times a day.  2.  Benzonatate 100 mg 3 times a day as needed.  3.  Biotin 2500 mcg daily.  4.  Bupropion 150 mg daily.  5.  Invokana 100 mg once a day.  6.  Ceftin 250 mg 2 times a day.  7.  Cetirizine 10 mg daily.  8.  Clotrimazole topical 2 times a day.  9.  Cyanocobalamin 1000 mcg daily.  10.  Doxycycline 100 mg 2 times a day.  11.  Ergocalciferol 50,000 international units once a week.  12.  Fluconazole 100 mg daily.  13.  Gabapentin 100 mg 3 times a day.  14.  Glipizide XL 5 mg oral once a day.  15.  Guaifenesin 600 mg 2 times a day.  16.  Humalog sliding scale.  17.  Lantus 9200 units subcutaneous once a day at bedtime.  18.  Loperamide 2 mg as needed for diarrhea.  19.  Losartan 25 mg daily.  20.  Meloxicam 7.5 mg daily.  21.  Metformin 1000 mg oral 2 times. 22.  Metoprolol succinate extended-release 50 mg daily.  23.  Nasonex 2 sprays nasal once a day.  24.  Omega-3 fatty acids 1000 mg 2 times a day.  25.  Omeprazole 40 mg daily.  26.  Prednisone taper.  27.  Probiotic formula 1 capsule daily.  28.  Proventil inhaled every 6 hours as needed for wheezing.  29.  Roflumilast 5 mcg oral daily.  30.  Singulair 10 mg daily.  31.  Sodium chloride 1000 mg tablet oral 2 times a day.  32.  Systane ophthalmic solution 1 drop to affected eye daily.  33.  Spiriva 18 mcg inhaled daily.  34.  Tramadol 50 mg oral every 6 hours as needed for pain.   PHYSICAL EXAMINATION:   VITAL SIGNS: Shows temperature 97.5, pulse of 92, blood pressure 134/79, saturating 98% on 2 L oxygen.  GENERAL: Obese Caucasian female patient lying in bed, in no significant distress.  PSYCHIATRIC: Alert and oriented x 3, pleasant.  HEENT: Atraumatic, normocephalic. Oral mucosa is moist and pink. External ears and nose normal. Pallor positive. No icterus. Pupils bilaterally equal and reactive to light.  NECK: Supple. No thyromegaly. No palpable lymph nodes. Trachea midline. No carotid bruit or JVD.  CARDIOVASCULAR: S1, S2, without any murmurs. Peripheral pulses 2+. No edema.  RESPIRATORY: Good air entry on both sides with some rhonchi on the right.  GASTROINTESTINAL: Soft abdomen. Tenderness in the right upper quadrant area. No rigidity or guarding. Murphy's sign negative. Has some bruising from heparin shots.  GENITOURINARY: No CVA tenderness or bladder distention.  SKIN: Warm and dry.  MUSCULOSKELETAL: No joint swelling, redness, effusion of the large joints. Normal muscle tone.  NEUROLOGICAL: Motor strength 5/5 in upper and lower extremities. Sensation is intact all over.  LYMPHATIC: No cervical lymphadenopathy.   LABORATORY STUDIES: Glucose of 217, BUN 28, creatinine 1.05 with sodium 138, potassium 4.2, lipase mildly elevated at 518.  Bilirubin 2.4, alkaline phosphatase 322 with AST 160 and ALT 323. Troponin less than 0.02.  WBC 15, hemoglobin 12.4, platelet count of 149,000.  Urinalysis shows no bacteria.   IMAGING: CT scan of the abdomen and pelvis with contrast shows improving nodularity of the left lung. Concern for bronchogenic carcinoma. Also gastrohepatic ligament adenopathy and periportal and porta hepatis lymphadenopathy, likely related to cirrhosis, but cannot exclude metastatic adenopathy.   ASSESSMENT AND PLAN:  1.  Transaminitis could be secondary to cirrhosis or metastatic disease. Case discussed with Dr. Alycia Rossetti from Emergency Room who was advised outpatient followup. At  this point, the patient does not have fever. Also, her elevated liver enzymes seem to be chronic. I discussed with the patient and family as to watch for any worsening jaundice or abdominal distention and return if there is any worsening in any way. They will call [facility] tomorrow for an appointment in within a week.  2.  Left post obstructive pneumonia with possible bronchogenic carcinoma. This seems to be improving on the CT scan. No fever. Shortness of breath at baseline. Continue medications and prednisone previously prescribed. She has mild leukocytosis likely from the prednisone. No bands.  3.  Diabetes mellitus. Continue medication.  4.  Case has been discussed with Dr. Fanny Bien in the Emergency Room who will be discharging the patient home. She has follow up with Dr. Meredeth Ide and Dr. Marcello Fennel on Tuesday. She will need follow up liver enzymes when she follows with her doctor.   Extra time spent today on this consult was 50 minutes   ____________________________ Molinda Bailiff. Ambrie Carte, MD srs:bm D: 10/02/2014 20:05:00 ET  T: 10/03/2014 06:48:01 ET JOB#: [id number]  cc: Herbon E. Meredeth Ide, MD Barbette Reichmann, MD Venida Jarvis, MD Eufelia Veno R. Natanya Holecek, MD, <Dictator>  Orie Fisherman MD ELECTRONICALLY SIGNED 10/03/2014 12:42

## 2015-02-07 NOTE — Consult Note (Signed)
PATIENT NAME:  Danielle Frank, Danielle Frank MR#:  [id number] DATE OF BIRTH:  1951-08-11  DATE OF CONSULTATION:  10/07/2014  REFERRING PHYSICIAN:  Barbette Reichmann, MD CONSULTING PHYSICIAN:  Joselyn Arrow, NP  CONSULTING GASTROENTEROLOGIST: Midge Minium, MD   PRIMARY GASTROENTEROLOGIST: Ezzard Standing. Bluford Kaufmann, MD  REASON FOR CONSULTATION: Ascites, jaundice.   HISTORY OF PRESENT ILLNESS: Danielle Frank is a 64 year old female who was admitted earlier this month with acute on chronic respiratory failure with pneumonia and COPD. Danielle Frank has noticed increasing abdominal distention. Danielle Frank had a colonoscopy with Dr. Bluford Kaufmann on 08/07/2014 and was found to have diverticulosis. Danielle Frank was recently diagnosed with a left hilar mass, left lower lobe mass, cirrhosis and a small amount of ascites on CT scan. Danielle Frank is diabetic. Danielle Frank tells me Danielle Frank has never had an EGD before. A CT also showed gastrohepatic ligament adenopathy. Danielle Frank liver function tests were normal 12/06, with the exception of an AST of 62. On 12/11, Danielle Frank total bilirubin rose to 1.5 and is now 3.1. Danielle Frank alkaline phosphatase rose to 189, now 542, AST 170, now 189, ALT 328, now 223. Danielle Frank denies any jaundice or pruritus. Danielle Frank denies any clay-colored stools or dark urine. Danielle Frank has had a poor appetite and nausea. Danielle Frank has had hyponatremia. Danielle Frank gives no history of liver disease. No history of hepatitis. Danielle Frank denies any nausea or vomiting.   PAST MEDICAL AND SURGICAL HISTORY:  Hyponatremia, COPD, pneumonia, hilar adenopathy, left lung mass, history of colon polyps, diabetes, chronic back pain, obstructive sleep apnea, acid reflux, migraine, status post cholecystectomy, bilateral tubal ligation, bilateral carpal tunnel release, thyroid nodule excision   MEDICATION PRIOR TO ADMISSION:  Advair Diskus 250/50 mcg 1 puff b.i.d., benzonatate 100 mg t.i.d. p.r.n., biotin 2500 mcg daily, buproprion 15 mg per 12 hours oral tablet daily, canaglifozin 100 mg daily, Ceftin 250 mg b.i.d. for 10 days, cetirizine 10 mg  daily, chlortrimazole topical 1% b.i.d. apply to affected area, cyanocobalamin 1000 mcg daily, doxycycline 100 mg b.i.d., ergocalciferol 50,000 international units weekly, fluconazole 100 mg daily, gabapentin 900 mg t.i.d., glipizide XL 5 mg daily, guaifenesin 600 mg q.12 hours, Humalog sliding scale, Lantus 100 units subcutaneous at bedtime, loperamide 2 mg 2 tablets p.r.n. diarrhea, losartan 25 mg daily, meloxicam 7.5 mg daily, metformin 1 gram b.i.d., metoprolol 50 mg daily, Nasonex 50 mcg 2 sprays daily, omega-3 polyunsaturated fats 2 times a day, omeprazole 40 mg daily, oxymetazoline nasal spray 0.5% two sprays b.i.d., prednisone 10 mg taper, probiotics daily and Proventil q.6 hours p.r.n., roflumilast 500 mcg daily, Singular 10 mg daily, sodium chloride 1 gram b.i.d., Systane optic solution one drop p.r.n., tiotropium 18 mcg inhalation capsule daily.   ALLERGIES: CODEINE, PERCOCET AND SULFA.   FAMILY HISTORY:  One son and Danielle Frank sister have Crohn disease, one daughter had a CVA, another son had a recent lung abscess. No colorectal carcinoma or liver disease.   SOCIAL HISTORY: Danielle Frank lives with Danielle Frank 75 year old mother who is Danielle Frank caregiver. Danielle Frank is divorced. Danielle Frank has 3 children. Danielle Frank quit smoking 5 years ago. Danielle Frank denies any alcohol or illicit drug use.   REVIEW OF SYSTEMS: See history of present illness.  PULMONARY: Chronic cough, nonproductive, shortness of breath on exertion.  DERMATOLOGIC:  Danielle Frank has multiple bruises. HEMATOLOGICAL: Easy bruising, easy bleeding.  Otherwise, negative 12-point review of systems.  PHYSICAL EXAMINATION:  VITAL SIGNS: Temperature 97.8, pulse 107, respirations 20, blood pressure 118/75, oxygen saturation 95% on 2 liters via nasal cannula. Height 63.9 inches, weight 169.1, BMI 29.1.  GENERAL: Danielle Frank  is a chronically ill appearing, pale Caucasian female who is alert, oriented, pleasant, and cooperative, in no acute distress. Danielle Frank daughter is at Danielle Frank bedside.  HEENT: Sclerae clear.  Conjunctivae pale. Oropharynx pink and moist. Danielle Frank has poor dentition.  NECK: Supple without any mass or thyromegaly.  CHEST: Heart regular rhythm. Normal S1, S2. No murmurs, clicks, rubs, or gallops.  LUNGS: With decreased breath sounds bilaterally, few expiratory wheezes, no acute distress.  ABDOMEN: Positive bowel sounds x4. Danielle Frank has multiple ecchymoses over Danielle Frank entire abdomen from previous injection sites. Abdomen is soft, mildly distended. No rebound, tenderness or guarding. Unable to palpate hepatosplenomegaly or mass. Exam is limited given patient's body habitus.  EXTREMITIES: Without edema. Danielle Frank does have clubbing.  SKIN: Multiple ecchymoses. No rash or jaundice.  NEUROLOGIC: Grossly intact.  PSYCHIATRIC: Alert, cooperative, normal mood and affect.  MUSCULOSKELETAL: Good equal movement and strength bilaterally.   LABORATORY STUDIES: See HPI.   IMAGING: See HPI.   IMPRESSION: Danielle Frank is a very pleasant 64 year old Caucasian female with persistent nodularity in the left lung, left hilar adenopathy, nodular liver with small volume ascites with patent portal veins and marked periportal and porta hepatis lymphadenopathy. Danielle Frank has been evaluated by Dr. Doylene Canninghoksi and has PET scan planned for tomorrow. Main concern would be for hepatic metastases and/or cirrhosis. It is likely that Danielle Frank has lung carcinoma. I have discussed Danielle Frank care with Dr. Midge Miniumarren Wohl. He is on-call tomorrow as well. I have discussed with the patient and Danielle Frank daughter further work-up of potential cirrhosis and ascites. Danielle Frank also has gastrohepatic ligament adenopathy, and has never had an esophagogastroduodenoscopy either. We have decided that it would be best to discuss Danielle Frank work-up further once PET scan has been reviewed and plan is in place by Dr. Doylene Canninghoksi. If there are multiple liver metastases, then Danielle Frank may require liver biopsy for diagnosis versus bronchoscopy. Danielle Frank liver function tests have increased significantly since early December.    PLAN:  1. Dr. Servando SnareWohl to re-evaluate patient tomorrow and review PET findings.  2. Continue supportive measures.   Thank you for allowing us to participate in the care of Danielle Frank and.     ____________________________ Joselyn ArrowKandice L. Dalonte Hardage, NP klj:kl D: 10/07/2014 22:13:37 ET T: 10/07/2014 22:40:55 ET JOB#: [id number]  cc: Joselyn ArrowKandice L. Kaileb [employer], NP, <Dictator> Barbette ReichmannVishwanath Hande, MD Joselyn ArrowKANDICE L Jabrea Kallstrom FNP ELECTRONICALLY SIGNED 10/16/2014 1:34

## 2015-02-07 NOTE — Consult Note (Signed)
Note Type Consult   Subjective: Chief Complaint/Diagnosis:   1.patient  is ex-smoker but has smoked for several years in the past has COPD recently was admitted in the hospital multiple times recent CT scan shows left hilar lymphadenopathy, left lower lobe mass, hepatomegaly and ascites.Patient had been admitted few weeks ago in the hospital with progressive respiratory failure or infection hyponatremia which is gradually improved  HPI:   DATE OF ADMISSION:  10/07/2014  HISTORY OF PRESENT ILLNESS:  The patient is a 64 year old female who recently was diagnosed with left hilar mass and postobstructive pneumonia, history of chronic obstructive pulmonary disease, type 2 diabetes, hypertension, back pain, osteoarthritis, gastroesophageal reflux disease who presents to the clinic complaining of abdominal pain and also back pain. The patient had recently been admitted for COPD exacerbation and postobstructive pneumonia had been sent home on prednisone taper as well as antibiotic therapy, but subsequently returned to the ED, and CT scan of the abdomen and pelvis showed evidence of nodularity of the left lower lobe with pneumonia. The patient also had nodular liver with small volume ascites suggesting cirrhosis and marked portal and porta hepatis lymphadenopathy in the gastrohepatic ligament which could be related to cirrhosis and the patient also had nodularity of the left adrenal gland and nodules within the subcutaneous ventral abdominal wall. The patient continues to be in pain and also has noted significant abdominal distention, poor appetite and nausea. She states that her cough and shortness of breath is better and she is currently down to 20 mg of prednisone on a tapering schedule.    Review of Systems:  General: weakness  fatigue  Performance Status (ECOG): 2  HEENT: no complaints  Lungs: cough  SOB  oxygen dependent  Cardiac: no complaints  GI: pain  distention  GU: no complaints   Musculoskeletal: no complaints  Extremities: swelling  Skin: no complaints  Neuro: no complaints  Psych: no complaints  Pain ?: No complaints (0, none)  Review of Systems: All other systems were reviewed and found to be negative   Allergies:  Codeine: Unknown  Percocet: Unknown  Sulfa drugs: Unknown  Significant History/PMH:   dm:    COPD:   Smoking History: Smoking History patient quit smoking 7 years ago.  PFSH: Family History: noncontributory  Additional Past Medical and Surgical History: oxygen dependent COPD   Home Medications: Medication Instructions Last Modified Date/Time  Ceftin 250 mg oral tablet 1 tab(s) orally bid x 10 days 17-Dec-15 20:01  predniSONE 10 mg oral tablet 40 mg po daily  3 days and decrease by 10 mg every 3 days until gone 17-Dec-15 16:46  Humalog 1 dose(s) subcutaneous , As Needed per sliding scale. 17-Dec-15 20:01  tiotropium 18 mcg inhalation capsule 1 cap(s) inhaled once a day 17-Dec-15 20:01  omega-3 polyunsaturated fatty acids 1000 mg oral capsule 1 cap(s) orally 2 times a day 17-Dec-15 20:01  Probiotic Formula - oral capsule 1 cap(s) orally once a day 17-Dec-15 20:01  doxycycline hyclate 100 mg oral capsule 1 cap(s) orally 2 times a day 17-Dec-15 20:01  Sodium Chloride 1000 mg oral tablet 1 tab(s) orally 2 times a day 17-Dec-15 20:01  benzonatate 100 mg oral capsule 1 cap(s) orally 3 times a day, As Needed 17-Dec-15 20:01  ergocalciferol 50,000 intl units oral capsule 1 cap(s) orally once a week 17-Dec-15 20:01  guaiFENesin 600 mg oral tablet, extended release 1 tab(s) orally every 12 hours, As Needed for cough 17-Dec-15 20:01  Lantus Solostar Pen 90-100unit(s) subcutaneous once  a day (at bedtime) 17-Dec-15 20:01  Proventil 1 unit(s) inhaled every 6 hours, As Needed - for Wheezing 17-Dec-15 20:01  biotin 2500 microgram(s) orally once a day 17-Dec-15 20:01  buPROPion 150 mg/12 hours oral tablet, extended release 1 tab(s) orally once a day  17-Dec-15 20:01  canagliflozin 100 mg oral tablet 1 tab(s) orally once a day 17-Dec-15 20:01  cetirizine 10 mg oral tablet 1 tab(s) orally once a day 17-Dec-15 20:01  clotrimazole topical 1% topical cream Apply topically to affected area 2 times a day 17-Dec-15 20:01  cyanocobalamin 1000 mcg oral tablet 1 tab(s) orally once a day 17-Dec-15 20:01  fluconazole 100 mg oral tablet 1 tab(s) orally once a day 17-Dec-15 20:01  GlipiZIDE XL 5 mg oral tablet, extended release 1 tab(s) orally once a day 17-Dec-15 20:01  Nasonex 50 mcg/inh nasal spray 2 spray(s) nasal once a day 17-Dec-15 20:01  losartan 25 mg oral tablet 1 tab(s) orally once a day 17-Dec-15 20:01  loperamide 2 mg oral tablet 2 tab(s) orally , As Needed - for Diarrhea 17-Dec-15 20:01  meloxicam 7.5 mg oral tablet 1 tab(s) orally once a day 17-Dec-15 20:01  oxymetazoline nasal 0.05% nasal spray 2 spray(s) nasal 2 times a day 17-Dec-15 20:01  Systane - ophthalmic solution 1 drop(s) to each affected eye as needed 17-Dec-15 20:01  roflumilast 500 mcg oral tablet 1 tab(s) orally every other day 17-Dec-15 20:01  metformin 1000 mg oral tablet 1 tab(s) orally 2 times a day 17-Dec-15 20:01  Advair Diskus 250 mcg-50 mcg inhalation powder 1 puff(s) inhaled 2 times a day 17-Dec-15 20:01  Singulair 10 mg oral tablet 1 tab(s) orally once a day (in the evening) 17-Dec-15 20:01  Metoprolol Succinate ER 50 mg oral tablet, extended release 1 tab(s) orally once a day 17-Dec-15 20:01  omeprazole 40 mg oral delayed release capsule 1 cap(s) orally once a day 17-Dec-15 20:01  gabapentin 100 mg oral capsule 1 milligram(s) orally 3 times a day 17-Dec-15 20:01   Vital Signs:  :: Has been reviewed   Physical Exam:  General: patient is alert oriented lying in the bed not in any acute distress  Eyes: pupils  are equal reacting to light.    No jaundice  pale   looking sclera  Head, Ears, Nose,Throat: normal, no lesions or deformities  Neck, Thyroid: no thyroid  tenderness, enlargement or nodule.  neck supple without massess or tenderness. no adenopathy. no carotid bruit.  Respiratory: emphysematous chest  Diminished air  entry on both sides  Cardiovascular: Tachycardia.  Soft systolic murmur.  Breast: not examined  Gastrointestinal: abdomen is soft.  Ascites.  Hepatomegaly.  Musculoskeletal: no swelling  of the joint.  Lower extremity no swelling  Skin: no rashes, ulcers, or lesions  Neurological: higher functions are within normal limit.  cranial nerves are intact  Muscle power tone within normal limit.  No focal signs.  No sensory deficit.  Lymphatics: no cervical, axillary, or inguinal lymphadenopathy   Laboratory Results: Hepatic:  22-Dec-15 12:10   Bilirubin, Total  3.1  Alkaline Phosphatase  542 (46-116 NOTE: New Reference Range 05/06/14)  SGPT (ALT)  223 (14-63 NOTE: New Reference Range 05/06/14)  SGOT (AST)  189  Total Protein, Serum  5.5  Albumin, Serum  2.8  Routine Chem:  22-Dec-15 12:10   Glucose, Serum  194  BUN  25  Creatinine (comp) 1.08  Sodium, Serum 142  Potassium, Serum 4.3  Chloride, Serum  109  CO2, Serum 22  Calcium (Total), Serum  10.1  Osmolality (calc) 293  eGFR (African American) >60  eGFR (Non-African American)  54 (eGFR values <31m/min/1.73 m2 may be an indication of chronic kidney disease (CKD). Calculated eGFR, using the MRDR Study equation, is useful in  patients with stable renal function. The eGFR calculation will not be reliable in acutely ill patients when serum creatinine is changing rapidly. It is not useful in patients on dialysis. The eGFR calculation may not be applicable to patients at the low and high extremes of body sizes, pregnant women, and vegetarians.)  Anion Gap 11  Routine UA:  22-Dec-15 13:47   Color (UA) Red  Clarity (UA) Turbid  Glucose (UA) >=500  Bilirubin (UA) Negative  Specific Gravity (UA) 1.024  Blood (UA) Negative  pH (UA) 5.0  Protein (UA) 30 mg/dL   Nitrite (UA) Negative  Leukocyte Esterase (UA) Negative (Result(s) reported on 07 Oct 2014 at 02:42PM.)  RBC (UA) 4 /HPF  WBC (UA) 9 /HPF  Bacteria (UA) NONE SEEN  Epithelial Cells (UA) 2 /HPF  Mucous (UA) PRESENT  Amorphous Crystal (UA) PRESENT (Result(s) reported on 07 Oct 2014 at 02:42PM.)  Routine Hem:  22-Dec-15 12:10   WBC (CBC) 9.5  RBC (CBC) 4.93  Hemoglobin (CBC) 12.1  Hematocrit (CBC) 39.1  Platelet Count (CBC)  104  MCV  79  MCH  24.5  MCHC  30.9  RDW  18.8  Neutrophil % 84.8  Lymphocyte % 9.5  Monocyte % 4.6  Eosinophil % 0.3  Basophil % 0.8  Neutrophil #  8.1  Lymphocyte #  0.9  Monocyte # 0.4  Eosinophil # 0.0  Basophil # 0.1 (Result(s) reported on 07 Oct 2014 at 12:34PM.)   Radiology Results: LabUnknown:    05-Dec-15 09:55, CT Chest Without Contrast  PACS Image     17-Dec-15 17:06, CT Abdomen and Pelvis With Contrast  PACS Image   CT:    05-Dec-15 09:55, CT Chest Without Contrast  CT Chest Without Contrast   REASON FOR EXAM:    SOB, hypoxia  COMMENTS:       PROCEDURE: CT  - CT CHEST WITHOUT CONTRAST  - Sep 20 2014  9:55AM     CLINICAL DATA:  Cough with nausea and vomiting for 6 days.  Ex-smoker. Possible pneumonia on radiographs. Initial encounter.    EXAM:  CT CHEST WITHOUT CONTRAST    TECHNIQUE:  Multidetector CT imaging of the chest was performed following the  standard protocol without IV contrast..  COMPARISON:  Chest radiographs 09/19/2014 and 09/27/2013.    FINDINGS:  Mediastinum: There are noenlarged mediastinal lymph nodes. Hilar  assessment is limited without contrast. However, there is concern of  a left hilar mass, primarily posteriorly. This is difficult to  separate from the adjacent pulmonary artery. The thyroid gland,  trachea and esophagus demonstrate no significant findings. The heart  size is normal. There is no pericardial effusion.There is  atherosclerosis of the aorta, great vessels and coronary  arteries.    Lungs/Pleura: There is no pleural effusion.There are moderate  changes of centrilobular emphysema. There is multifocal airspace  disease within the superior segment of the left lower lobe,  extending peripherally from the suspected left hilar mass. There is  minimal atelectasis in the basilar components of both lower lobes  without consolidation. No dominant peripheral lung mass or  endobronchial lesion demonstrated.    Upper abdomen: The liver appears mildly enlarged with low density  consistent with steatosis. There is no adrenal mass.    Musculoskeletal/Chest  wall: There is no axillary adenopathy or chest  wall mass. There are no suspicious osseous findings. Mild  degenerative changes are present throughout the thoracic spine.     IMPRESSION:  1. There is suspicion of a left hilar mass with postobstructive  pneumonitis in the superior segment of the left lower lobe. Although  these findings could represent non complicated pneumonia with  reactive hilar adenopathy, underlying bronchogenic carcinoma cannot  be excluded, especially given the underlying emphysema implying risk  factors.  2. If the patient clinically has pneumonia, short term (4 week) CT  follow-up with contrast after appropriate antibiotic therapy  recommended. Bronchoscopy should be considered if this is felt to  reflect a malignant process.  3. Emphysema and atherosclerosis as described.      Electronically Signed    By: Camie Patience M.D.    On: 09/20/2014 11:58       Verified By: Vivia Ewing, M.D.,    17-Dec-15 17:06, CT Abdomen and Pelvis With Contrast  CT Abdomen and Pelvis With Contrast   REASON FOR EXAM:    (1) RUQ pain with transaminitis and elevated   bilirubin; (2) RUQ pain with transa  COMMENTS:   May transport without cardiac monitor    PROCEDURE: CT  - CT ABDOMEN / PELVIS  W  - Oct 02 2014  5:06PM     CLINICAL DATA:  Abdominal pain nausea vomiting. Recent discharge  from  hospital. Potential left lower pneumonia versus postobstructive  pneumonitis from obstructing left hilar mass.    EXAM:  CT ABDOMEN AND PELVIS WITH CONTRAST    TECHNIQUE:  Multidetector CT imaging of the abdomen and pelvis was performed  using the standard protocol following bolus administration of  intravenous contrast.    CONTRAST:  100 mL Omnipaque    COMPARISON:  CT chest 09/20/2014    FINDINGS:  Lower chest: Nodular focus in the superior segment ofthe left lower  lobe is slightly decreased in size measuring 19 x 18 mm compared to  20 x 25 mm on prior. There is thickened linear density central to  this lesion. Again cannot exclude a obstructing mass in left hilum  seen on comparison CT.    Hepatobiliary: The liver has a fine nodular appearance. A small  amount of fluid along the right hepatic margin. Postcholecystectomy.  There enlarged periportal lymph nodes. Lymph node in the  gastrohepatic ligament measures 23 mm short axis. The no posterior  to the portal vein measures 26 mm short axis. Postcholecystectomy.  No biliary duct dilatation.    Pancreas: Pancreas is normal. No ductal dilatation. No pancreatic  inflammation.    Spleen: Normal spleen.    Adrenals/urinary tract: Adrenal glands are mildly nodular No mass  lesion present. Potential nodule of the left adrenal gland measures  8 mm on image 27, series 2.  Kidneys, ureters, bladder normal.    Stomach/Bowel: Stomach, small bowel, cecum are normal. The colon and  rectosigmoid colon are normal.    Vascular/Lymphatic: Abdominal or is normal caliber with intimal  calcification. There is a lymph node retroperitoneal lymph aorta  measuring 11 mm. No pelvic lymphadenopathy.    Reproductive: Uterus and ovaries are normal. There is small amount  of fluid the pelvis.    Musculoskeletal: No aggressive osseous lesion.    Other: There is soft tissue nodule within the subcutaneous tissue of  the abdominal wall measuring  18 mm on image 868 below the like is.  There several additional soft tissue  nodules the abdominal wall.  These may relate to injection granulomas.    Small precordial lymph node measures 9 mm on image 5.     IMPRESSION:  1. Persistent but improved nodularity in the left lower lobe with  differential including pneumonia versus postobstructive pneumonitis  / pneumonia related to potential central obstructing mass seen on  comparison CT. Consider bronchoscopy or FDG PET scan for further  evaluation versus short-term CT follow-up after therapy  2. Nodular liver with small volume ascites suggests cirrhosis.  Portal veins are patent.  3. Marked periportal and porta hepatis lymphadenopathy. This may  relate to cirrhosis but cannot exclude metastatic adenopathy.  4. Adenopathy in the gastrohepatic ligament. Again this may relate  to cirrhosis. With epigastric pain also consider gastric carcinoma  for esophageal carcinoma. Recommend clinical correlation for  dysphagia and consider upper endoscopy.  5. Mild nodularity of the left adrenal gland versus adjacent small  lymph node.  6. Nodules within the subcutaneous ventral abdominal wall may relate  to injection granulomas.      Electronically Signed    By: Suzy Bouchard M.D.    On: 10/02/2014 17:27     Verified By: Rennis Golden, M.D.,   Assessment and Plan: Impression:   1.patient with left hilar adenopathy.Marland Kitchenlower lobe mass is a chronic smoker has quit smoking 9 years ago   situation with patient and family also discuss situation with our interventional pulmonologist  Drr . Kasa to see whether   navigational bronchoscopy  a bus can give [place] an answer regarding diagnosisgoing to get PET scan done to see if liver shows multiple area then developed biopsy can be plannedis agreeable to ithas decided for no codeScan has been orderedhas been discussed with primary care physician  Plan:   Start oxycodone prnprn  Electronic  Signatures: Meira Wahba, Martie Lee (MD)  (Signed 22-Dec-15 16:38)  Authored: Note Type, CC/HPI, Review of Systems, ALLERGIES, PAST MEDICAL HISTORY, Smoking Cessation, Patient Family Social History, HOME MEDICATIONS, Vital Signs, Physical Exam, Lab Results Review, Rad Results Review, Assessment and Plan   Last Updated: 22-Dec-15 16:38 by Jobe Gibbon (MD)

## 2015-02-07 NOTE — Discharge Summary (Signed)
PATIENT NAME:  Danielle Frank MR#:  [id number] DATE OF BIRTH:  1950/12/14  DATE OF ADMISSION:  09/19/2014 DATE OF DISCHARGE:  09/30/2014  DISCHARGE DIAGNOSES: 1.  Acute on chronic respiratory failure secondary to chronic obstructive pulmonary disease exacerbation.  2.  Left hilar mass with postobstructive pneumonia.  3.  Hyponatremia.  4.  Type 2 diabetes.  5.  Obstructive sleep apnea.  6.  Gastroesophageal reflux disease.   CHIEF COMPLAINT: Nausea, vomiting, shortness of breath.   HISTORY OF PRESENT ILLNESS: Danielle Frank is a 64 year old female with a history of COPD, chronic respiratory failure on oxygen at 2 liters nasal cannula, and type 2 diabetes who presents to the ED complaining of nausea, vomiting. The patient had been placed on Levaquin for possible pneumonia recently and subsequently was given Zofran, but symptoms continued to get worse, and the patient described a sensation of cramping in her abdomen, epigastric area, associated with shortness of breath, cough with productive white sputum.   PAST MEDICAL HISTORY: Significant for COPD, chronic respiratory failure with 2 liters nasal cannula oxygen, type 2 diabetes, obstructive sleep apnea.   Please see H and P for full details.   HOSPITAL COURSE: The patient was admitted to [facility] and placed on IV antibiotics. She continued to have episodes of shortness of breath with increased work of breathing and use of accessory muscles of respiration and required nebulized bronchodilator therapy as well as IV steroids. She also underwent CT scan of the chest, which showed a suspicious left hilar mass with obstructive pneumonitis in the superior segment of the left lower lobe, although these findings could also represent noncomplicated pneumonia with reactive hilar adenopathy. Bronchogenic carcinoma cannot be excluded. Evidence of emphysema and atherosclerosis was also noted. The patient was seen by Dr. Meredeth Ide, pulmonologist, and also seen by Dr.  Wynelle Link, nephrologist, as started tolvaptan for hyponatremia and subsequently changed to demeclocycline. The patient was started on Zosyn and IV vancomycin, which appeared to improve her symptoms to some extent, although she remained somewhat short of breath. It was felt that she was stable for discharge with home health nursing.   DISCHARGE MEDICATIONS: Ceftin 250 mg p.o. b.i.d. for 10 days, doxycycline 100 mg p.o. b.i.d. for 10 days, prednisone taper starting at 40 mg a day for 3 days and decrease by 10 mg every 3 days until gone, roflumilast 500 mcg 1 tablet every other day, Systane ophthalmic solution 1 drop in each effected eye as needed, oxymetazoline nasal spray 2 sprays b.i.d., probiotic capsule 1 capsule once a day, omega-3 fish capsule 1 capsule b.i.d., meloxicam 7.5 mg once a day as needed, loperamide 2 mg b.i.d. as needed for diarrhea, losartan 25 mg once a day, Nasonex nasal spray 50 mcg 2 sprays once a day, guaifenesin 600 mg extended-release once a day for cough, glipizide XL 5 mg once a day, fluconazole 100 mg once a day, ergocalciferol 50,000 units once a week, cyanocobalamin 1000 mcg once a day, clotrimazole topical 1% topical cream apply topically to effected area b.i.d., cetirizine 10 mg once a day, canagliflozin 100 mg once a day, bupropion 150 every 12 hours 1 tab once a day, benzonatate 100 mg once a day as needed for cough, Proventil inhaler 2 puffs q. 6 hours as needed for wheezing, Lantus SoloSTAR pen 90 to 100 units subcutaneous once a day at bedtime, Humalog subcutaneous pre meals as per sliding scale, Gabapentin 100 mg t.i.d., omeprazole 40 mg once a day, metoprolol succinate 50 mg once a day, Singulair  10 mg once a day, Advair Diskus 250/50 one puff b.i.d., metformin 1000 mg p.o. b.i.d.   DISCHARGE INSTRUCTIONS: The patient is advised to use oxygen 2 liters nasal cannula. Advised sodium chloride tablets as well for hyponatremia, and to follow up with me, Dr. Marcello FennelHande, in 1 to 2 weeks.  The patient will also follow up with Dr. Meredeth IdeFleming for further work-up on the hilar mass to rule out underlying bronchogenic CA. The patient was stable at the time of discharge.   TOTAL TIME SPENT IN DISCHARGING THE PATIENT: 35 minutes.  ____________________________ Barbette ReichmannVishwanath Ashden Sonnenberg, MD vh:sb D: 10/03/2014 12:56:36 ET T: 10/03/2014 16:14:37 ET JOB#: [id number]  cc: Barbette ReichmannVishwanath Mylia Pondexter, MD, <Dictator> Barbette ReichmannVISHWANATH [facility] MD ELECTRONICALLY SIGNED 10/07/2014 13:43

## 2015-02-07 NOTE — Consult Note (Signed)
Brief Consult Note: Diagnosis: jaundice, ascites.   Patient was seen by consultant.   Comments: Ms. Danielle Frank is a very pleasant 64 y/o caucasian female with persistent nodularity in the left lung, nodular liver with small volume ascites with patent portal veins, & marked periportal and porta hepatis lymphadenopathy.  She has been evaluated by Dr Doylene Canninghoksi & has PET planned for tomorrow.  Main concern would be for hepatic metastases &/or cirrhosis in setting of lung cancer.  Dr Servando SnareWohl on call tomorrow.  Thanks for allowing us to participate in her care.  Please see full dictated note. #[id number].  Electronic Signatures: Joselyn ArrowJones, Ethen Bannan L (NP)  (Signed 22-Dec-15 22:14)  Authored: Brief Consult Note   Last Updated: 22-Dec-15 22:14 by Joselyn ArrowJones, Chariah Bailey L (NP)

## 2015-02-08 NOTE — H&P (Signed)
PATIENT NAME:  Danielle Frank, Danielle Frank MR#:  [id number] DATE OF BIRTH:  [birth year]  DATE OF ADMISSION:  10/19/2011  CHIEF COMPLAINT: Fever, vomiting, productive bloody cough, aching of her chest and back, decreased energy, weakness.   HISTORY OF PRESENT ILLNESS: Danielle Frank is a 64 year old female with a history of COPD, migraine headaches, and type II diabetes who presented to the clinic complaining of fever as high as 102 for the last one week associated with productive brownish bloody-looking sputum. She also has been complaining of decreased energy associated with loss of appetite, shortness of breath, and aching in her chest mainly in the left lower back area. It is worse with deep inspiration. The patient has been using her oxygen but continues to be symptomatic.   PAST MEDICAL HISTORY:  1. Type II diabetes.  2. Hyperlipidemia.  3. Chronic obstructive pulmonary disease. 4. Migraine headaches.  5. History of colonic polyps. 6. Questionable sleep apnea.   PAST SURGICAL HISTORY:  1. Bilateral tubal ligation. 2. Carpal tunnel surgery bilaterally. 3. Excision of thyroid nodule. 4. Laparoscopic cholecystectomy.   FAMILY HISTORY: Positive for cancer in her dad and grandfather.   CURRENT MEDICATIONS:  1. Advair Diskus 250/50 1 puff b.i.d.  2. Albuterol inhaler 2 puffs 4 times a day as needed.  3. Aspirin 81 mg a day.  4. Meclizine 25 mg p.r.n.  5. Pravastatin 80 mg once a day.  6. Singulair 10 mg a day.  7. Zyrtec 10 mg a day.  8. Spiriva HandiHaler 18 mcg 1 capsule a day.  9. Tylenol Extra-Strength 2 tablets as needed.  10. Oxygen 2 liters nasal cannula with exertion.  11. Metoprolol 25 mg b.i.d.  12. Victoza 1.8 mg sub-Q once a day. 13. Afrin nasal spray two sprays twice a day as needed.  14. Lantus insulin 60 units sub-Q at bedtime.  15. Metformin 1000 mg p.o. b.i.d.  16. Glyburide 5 mg once a day.  17. Humalog Quik Pen as per sliding scale.   PHYSICAL EXAMINATION:   GENERAL:  She appeared in significant distress, tachypneic.  VITAL SIGNS: Weight 172 pounds, temperature 96.9, blood pressure 120/76, pulse 125, oxygen sat initially was 82% on 2 liters, subsequently improved to 91%.   HEENT: Normocephalic, atraumatic. Pupils equal and reactive to light. Oropharynx congestion and postnasal drip.   NECK: No thyromegaly.   LUNGS: Bilaterally inspiratory and expiratory wheezes. Crackles + left base posteriorly  ABDOMEN: Soft, nontender.   EXTREMITIES: No edema.   NEUROLOGIC: Alert and oriented. No obvious focal signs.   IMPRESSION:  1. Acute respiratory failure secondary to chronic obstructive pulmonary disease exacerbation and Pneumonia 2. Type II diabetes.  3. History of tachycardia with palpitations.  4. History of hyperlipidemia.  5. History of osteoarthritis.   PLAN:  1. [facility]  2. Will treat her with IV antibiotics - Levaquin 500 mg iv  q daily 3. Supplemental oxygen.  4. Nebulized bronchodilator therapy. 5. IV steroids.  6. Will continue Lantus 65 units at bedtime.  7. Add sliding scale insulin.   8. Will also obtain blood cultures, rapid flu test, chest x-ray, basic labs, and ABG today.  9. Continue Tylenol for fever.  10. Will also obtain a sputum for Gram stain C and S and tailor antibiotic therapy as appropriate.   ____________________________ Barbette ReichmannVishwanath Bear Osten, MD vh:drc D: 10/19/2011 12:24:39 ET Frank: 10/19/2011 12:51:15 ET JOB#: [id number]  cc: Barbette ReichmannVishwanath Cainan Trull, MD, <Dictator> Barbette ReichmannVISHWANATH Daison Braxton MD ELECTRONICALLY SIGNED 10/25/2011 17:39

## 2015-02-08 NOTE — Discharge Summary (Signed)
PATIENT NAME:  Danielle Frank, Danielle Frank MR#:  [id number] DATE OF BIRTH:  [birth year]  DATE OF ADMISSION:  10/19/2011 DATE OF DISCHARGE:  10/25/2011  DISCHARGE DIAGNOSES: 1. Acute respiratory failure secondary to chronic obstructive pulmonary disease exacerbation and bilateral pneumonia.  2. Type 2 diabetes.  3. Hyperlipidemia.  4. Osteoarthritis.  5. Weakness.   CHIEF COMPLAINT: Cough, productive bloody sputum, achiness of her chest, decreased energy, weakness, and general malaise.   HISTORY OF PRESENT ILLNESS: Danielle Frank is a 64 year old female with history of chronic obstructive pulmonary disease, migraine headaches, and type 2 diabetes who presented to the clinic complaining of fever of a one week duration associated with productive brownish bloody-looking sputum. She also complained of decreased energy with loss of appetite and achiness in the chest as well as left lower back. The patient and had been using her oxygen almost continuously without much relief of symptoms.   PAST MEDICAL HISTORY:  1. Type 2 diabetes.  2. Hypertension.  3. Chronic obstructive pulmonary disease.  4. Migraine headaches.  5. History of colonic polyps.   PAST SURGICAL HISTORY:  1. Bilateral tubal ligation.  2. Carpal tunnel surgery. 3. Bilaterally excision of thyroid nodule. 4. Laparoscopic cholecystectomy.    PHYSICAL EXAMINATION: GENERAL: She was in significant distress, tachypneic. Oxygen saturation initially was 82% on 2 liters and subsequently improved to 91%. Weight 172 pounds. VITAL SIGNS: Temperature 96.9. Blood pressure 120/76, pulse was 125. HEENT: Normocephalic, atraumatic. Oropharynx congestion with postnasal drip. NECK: No thyromegaly. LUNGS: Bilaterally inspiratory and expiratory wheezes with crackles heard especially at left base posteriorly. ABDOMEN: Soft, nontender. EXTREMITIES: No edema. NEUROLOGIC: Nonfocal.   HOSPITAL COURSE: [facility] and started on IV Solu-Medrol, IV  Levaquin, and intravenous fluids. Initial labs showed WBC count 7.3, hemoglobin 12.0, hematocrit 36.1, platelets 180. Troponin less than 0.2, CK-MB 2.9. Glucose 115, BUN 29, creatinine 1.29, sodium 136, potassium 4.0, chloride 102, CO2 19, anion gap 15. During her stay in the hospital, the patient gradually improved. Her chest x-ray PA and lateral showed evidence of patchy bilateral pulmonary infiltrates compatible with pneumonia. A follow-up film showed significant improvement in these infiltrates. The patient continued to make gradual progress. She was seen by physical therapy. Sputum cultures grew streptococcus pneumonia that was sensitive to levofloxacin. She was also started on p.o. Bactrim. Symptomatically the patient improved and was discharged in stable condition on the following medications.   DISCHARGE MEDICATIONS:  1. Prednisone 30 mg a day for three days, decrease to 20 mg for three days, then 10 mg for three days, then stop.  2. Levaquin 5 mg p.o. daily for one week.  3. Oxygen 2 liters nasal cannula.  4. Nystatin swish and swallow 1 tsp p.o. Frank.i.d. for 10 days.  5. Pravastatin 80 mg a day.  6. Metoprolol succinate 50 mg once a day.  7. Nasonex nasal spray two sprays once a day.  8. Singulair 10 mg a day.  9. Glyburide 5 mg once a day.  10. Lantus insulin 50 units once a day.  11. Bentyl inhaler 2 puffs q.i.d. p.r.n.  12. Spiriva HandiHaler 18 mcg 1 capsule once a day.  13. Advair Diskus 250/50, 1 inhalation b.i.d.  14. Metformin 1000 mg p.o. b.i.d.  15. Humalog sliding scale.  16. Victoza 18 mg subcutaneous daily.   FOLLOW-UP: The patient has been advised to follow-up in the clinic with me, Dr. Marcello FennelHande, in 1 to 2 weeks' time.    ____________________________ Barbette ReichmannVishwanath Ane Conerly, MD vh:rbg D:  10/25/2011 17:45:54 ET Frank: 10/27/2011 10:16:21 ET JOB#: [id number]  cc: Barbette Reichmann, MD, <Dictator> Barbette Reichmann MD ELECTRONICALLY SIGNED 12/01/2011 12:34

## 2015-02-11 NOTE — Consult Note (Signed)
Chief Complaint:  Subjective/Chief Complaint FATIGUE, Some irritation mouth NO VOMITING, DIARRHEA, OR SOB   VITAL SIGNS/ANCILLARY NOTES: **Vital Signs.:   02-Jan-16 05:41  Vital Signs Type Routine  Temperature Temperature (F) 97.9  Celsius 36.6  Temperature Source oral  Pulse Pulse 99  Respirations Respirations 21  Systolic BP Systolic BP 662  Diastolic BP (mmHg) Diastolic BP (mmHg) 72  Mean BP 90  Pulse Ox % Pulse Ox % 94  Pulse Ox Activity Level  At rest  Oxygen Delivery 2L    07:38  Vital Signs Type Pre Medication  Pulse Pulse 95  Respirations Respirations 20  Systolic BP Systolic BP 947  Diastolic BP (mmHg) Diastolic BP (mmHg) 73  Mean BP 91  Pulse Ox % Pulse Ox % 93  Pulse Ox Activity Level  At rest  Oxygen Delivery 2L   Brief Assessment:  GEN no acute distress   Respiratory normal resp effort   Gastrointestinal details normal Nontender  liver enlarged and abdo distended   Lab Results: Hepatic:  02-Jan-16 06:16   Bilirubin, Total  15.1  Alkaline Phosphatase  898 (46-116 NOTE: New Reference Range 05/06/14)  SGPT (ALT)  249 (14-63 NOTE: New Reference Range 05/06/14)  SGOT (AST)  244  Total Protein, Serum  5.0  Albumin, Serum  2.3  Routine Chem:  02-Jan-16 06:16   BUN  50  Creatinine (comp) 1.24  Sodium, Serum  134  Potassium, Serum  5.2  Chloride, Serum 103  CO2, Serum 23  Calcium (Total), Serum 8.5  Osmolality (calc) 294  eGFR (African American)  56  eGFR (Non-African American)  46 (eGFR values <27m/min/1.73 m2 may be an indication of chronic kidney disease (CKD). Calculated eGFR, using the MRDR Study equation, is useful in  patients with stable renal function. The eGFR calculation will not be reliable in acutely ill patients when serum creatinine is changing rapidly. It is not useful in patients on dialysis. The eGFR calculation may not be applicable to patients at the low and high extremes of body sizes, pregnant women, and vegetarians.)   Anion Gap 8  Routine Hem:  02-Jan-16 06:16   WBC (CBC) 8.2  RBC (CBC) 4.44  Hemoglobin (CBC)  11.5  Hematocrit (CBC) 36.5  Platelet Count (CBC) 176 (Result(s) reported on 18 Oct 2014 at 07:12AM.)  MCV 82  MCH 26.0  MCHC  31.6  RDW  24.5  Segmented Neutrophils 99  Lymphocytes 1  Diff Comment 1 ANISOCYTOSIS  Diff Comment 2 POIKILOCYTOSIS  Diff Comment 3 HYPOCHROMIA  Diff Comment 4 TARGET CELLS  Diff Comment 5 PLTS VARIED IN SIZE  Result(s) reported on 18 Oct 2014 at 07:12AM.   Assessment/Plan:  Assessment/Plan:  Assessment SMALL CELL LUNG CANCER, CHEMOTX  S/P 2 DAYS  OF tx, day 3 discontinued.  Today is day 4 of cycle 1.   K, minimally higher, uric acid normal yesterday, not repeated . CRE AND LFTS, AND CBC . hiolding since yesterday,  note on 1/1.FOUND BILI SIGNIFICANTLY HIGHER AT 14.3.  POSSIBLE THRUSH   Plan ONE DOSE KAYEXALATE TODAY, START NYSTATIN,  CHECK LIVER U/S FOR BILIARY OBSTRUCTION.  YESTERDAYCONSULTED PHARMACY TO ADJUST OTHER MEDS FOR HEPATIC FAILURE. F/U CRE, K, LFTS, URIC ACID   Electronic Signatures: GDallas Schimke(MD)  (Signed 02-Jan-16 10:26)  Authored: Chief Complaint, VITAL SIGNS/ANCILLARY NOTES, Brief Assessment, Lab Results, Assessment/Plan   Last Updated: 02-Jan-16 10:26 by GDallas Schimke(MD)

## 2015-02-11 NOTE — Consult Note (Signed)
PATIENT NAME:  Danielle Frank, Danielle Frank MR#:  [id number] DATE OF BIRTH:  05-29-1951  DATE OF CONSULTATION:  09/21/2014  CONSULTING PHYSICIAN:  Selda Jalbert Lizabeth Leyden, MD  REQUESTING PHYSICIAN: Aram Beecham, MD  REASON FOR CONSULTATION: Hyponatremia.   HISTORY OF PRESENT ILLNESS: The patient very pleasant, 64 year old, Caucasian female with a past medical of COPD with chronic respiratory failure, diabetes mellitus type 2, obstructive sleep apnea on CPAP and hyperlipidemia who presented to the [facility] with nausea and vomiting. Two weeks prior to admission, the patient was diagnosed with pneumonia by Dr. Meredeth Ide and was started on Levaquin. More recently, however, the patient reports that she developed nausea and vomiting. She states that the nausea, vomiting were rather persistent over the past 3 days prior to admission. She subsequently called her PCP and Zofran was given; however, her symptoms had persisted. She then presented here. She was found to have hyponatremia upon presentation with a serum sodium of 121. It was felt that there may have been an element of volume depletion; therefore, she was started on hydration with 0.9 normal saline. Serum sodium has been as low as 119 and today is 123. She has severe underlying COPD. CT scan of the chest was also performed, which demonstrated a suspicious left hilar mass with postobstructive pneumonitis in the superior segment of left lower lobe. She is on antibiotic therapy at present with azithromycin.   PAST MEDICAL HISTORY: 1. COPD with chronic respiratory failure on 2 liters nasal cannula at home.  2. Type 2 diabetes mellitus.  3. Obstructive sleep apnea.  4. Hyperlipidemia.   ALLERGIES: CODEINE, PERCOCET AND SULFA DRUGS.   CURRENT INPATIENT MEDICATIONS: Include:  1. Normal saline 0.9 at 100 mL/hour.  2. Bupropion 150 mg p.o. b.i.d.  3. Zyrtec 10 mg p.o. daily.  4. Fluconazole 100 mg daily.  5. Advair 250/50 one puff inhaled b.i.d.   6. Heparin 5000 units subcutaneous every 8 hours.  7. Levemir 35 units subcutaneous every 24 hours.  8. Losartan 25 mg p.o. daily.  9. Mobic 7.5 mg p.o. daily.  10. Solu-Medrol 60 mg IV every 12 hours.  11. Metoprolol 50 mg p.o. daily.  12. Singulair 10 mg p.o. at bedtime.  13. Morphine 2 to 4 mg IV every 4 hours p.r.n.  14. Protonix 40 mg p.o. daily.  15. Roflumilast 500 mcg p.o. daily.  16. Spiriva 1 capsule inhaled daily.  17. Neurontin 300 mg p.o. 3 times a day.  18. Sliding scale insulin.  19. Phenergan 12.5 mg every 6 hours p.r.n.  20. Maalox 30 mL every 4 hours p.r.n.  21. Azithromycin 500 mg IV every 24 hours.   SOCIAL HISTORY: The patient resides in [place]. She has several family members residing with her. She quit smoking in 2005. Denies alcohol or illicit drug use.   FAMILY HISTORY: The patient's father died secondary to lung cancer. The patient's mother is still alive in her 40s.   REVIEW OF SYSTEMS:  CONSTITUTIONAL: Denies fevers, chills, weight loss.   EYES: Denies diplopia, blurry vision.   HEENT: Denies headaches, hearing loss. Denies epistaxis.   CARDIOVASCULAR: Denies chest pain, palpitations, PND.   RESPIRATORY: Endorses cough and shortness of breath, but denies hemoptysis.   GASTROINTESTINAL: Recent nausea and vomiting noted as well as poor p.o. intake.   GENITOURINARY: Denies frequency, urgency or dysuria.   MUSCULOSKELETAL: Denies joint pain, swelling or redness.   INTEGUMENTARY: No skin rashes or lesions.   NEUROLOGIC: Endorses some numbness, but denies focal  weakness.   PSYCHIATRIC: Denies anxiety or bipolar disorder.   HEMATOLOGIC AND LYMPHATIC: Denies easy bruisability, bleeding or swollen lymph nodes.   ALLERGY AND IMMUNOLOGIC: Denies seasonal allergies or history of immunodeficiency.   PHYSICAL EXAMINATION: VITAL SIGNS: Temperature 97.3, pulse 87, respirations 19, blood pressure 119/69.   GENERAL: Reveals a well-developed,  well-nourished, Caucasian female who appears her stated age, currently in no acute distress.   HEENT: Normocephalic, atraumatic. Extraocular movements are intact. Pupils are equal, round and reactive to light. No scleral icterus. Conjunctivae are pink. No epistaxis noted. Gross hearing intact. Oral mucosa are moist.   NECK: Supple without JVD or lymphadenopathy.   LUNGS: Demonstrate bilateral rhonchi with normal respiratory effort.   HEART: S1, S2 regular rate and rhythm. No murmurs, rubs or gallops appreciated.   ABDOMEN: Obese, soft, nontender, nondistended. Bowel sounds positive. No rebound or guarding. No gross organomegaly appreciated.   EXTREMITIES: No clubbing, cyanosis or edema.   NEUROLOGIC: The patient is awake, alert, oriented to time, person and place. Strength is 5/5 in both upper and lower extremities.   SKIN: Warm and dry. No rashes noted.   MUSCULOSKELETAL: No joint redness, swelling or tenderness appreciated.   GENITOURINARY: No suprapubic tenderness noted at this time.   PSYCHIATRIC: The patient with appropriate affect and appears to have good insight into her current illness.   LABORATORY DATA: Sodium 123, potassium 3.6, chloride 92, CO2 of 20, BUN 12, creatinine 0.76, glucose 155, total protein 6.0, albumin 3.1, total bilirubin 0.8, alkaline phosphatase 101, AST 62 and ALT 57. CBC shows WBC 7.1, hemoglobin 10, hematocrit 30, platelets 175,000. Urine sodium 58. Urine osmolality 677. TSH normal at 0.6.   RADIOLOGIC DATA: CT of the chest shows left hilar mass with postobstructive pneumonitis in the superior segment of the left lower lobe.   IMPRESSION: This 64 year old, Caucasian female with past medical history of chronic obstructive pulmonary disease with chronic respiratory failure, diabetes mellitus type 2, obstructive sleep apnea and hyperlipidemia, who presented to [facility] with nausea and vomiting, was found to have hyponatremia along with a  bacterial pneumonia as well as a left hilar mass.   PROBLEM LIST: 1. 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  2. Acute exacerbation of chronic obstructive pulmonary disease.  3. Left hilar mass.  4. Bacterial pneumonia.   PLAN: The patient presented to [facility] with hyponatremia. She was felt to have an acute exacerbation of chronic obstructive pulmonary disease. CT scan of the chest was performed, which demonstrated pneumonia as well as left hilar mass. Therefore, the most likely etiology for her underlying hyponatremia is syndrome of inappropriate antidiuretic hormone secretion either from pneumonia, lung mass or both. Serum sodium currently 123. We will discontinue 0.9 normal saline. We will also check serum uric acid level. We plan to administer tolvaptan 15 mg p.o. x 1 dose and determine the response. We also advised the patient on fluid restriction of 1200 mL of free water per day. This will likely continue to be an ongoing issue for the patient. Further plan as the patient progresses.     ____________________________ Lennox PippinsMunsoor N. Sherod Cisse, MD mnl:TT D: 09/21/2014 14:26:32 ET T: 09/21/2014 14:56:52 ET JOB#: [id number]  cc: Lennox PippinsMunsoor N. Hattie Pine, MD, <Dictator> Ria CommentMUNSOOR N Anupama Piehl MD ELECTRONICALLY SIGNED 10/23/2014 15:05

## 2015-02-11 NOTE — Consult Note (Signed)
Chief Complaint:  Subjective/Chief Complaint FATIGUE, SOME MID ABDO PAIN , NO VOMITING, DIARRHEA, OR SOB   VITAL SIGNS/ANCILLARY NOTES: **Vital Signs.:   01-Jan-16 05:45  Vital Signs Type Routine  Temperature Temperature (F) 97.7  Celsius 36.5  Temperature Source oral  Pulse Pulse 91  Respirations Respirations 22  Systolic BP Systolic BP 161  Diastolic BP (mmHg) Diastolic BP (mmHg) 67  Mean BP 86  Pulse Ox % Pulse Ox % 90  Pulse Ox Activity Level  At rest  Oxygen Delivery 2L   Brief Assessment:  GEN no acute distress   Respiratory normal resp effort   Gastrointestinal details normal Nontender  liver enlarged and abdo distended   Lab Results: Hepatic:  01-Jan-16 11:19   Bilirubin, Total  14.2  Alkaline Phosphatase  857 (46-116 NOTE: New Reference Range 05/06/14)  SGPT (ALT)  241 (14-63 NOTE: New Reference Range 05/06/14)  SGOT (AST)  248  Total Protein, Serum  4.8  Albumin, Serum  2.2  Routine Chem:  01-Jan-16 03:25   Glucose, Serum  303  BUN  46  Creatinine (comp) 1.22  Sodium, Serum  133  Potassium, Serum 5.1  Chloride, Serum 103  CO2, Serum 23  Osmolality (calc) 290  eGFR (African American)  57  eGFR (Non-African American)  47 (eGFR values <30m/min/1.73 m2 may be an indication of chronic kidney disease (CKD). Calculated eGFR, using the MRDR Study equation, is useful in  patients with stable renal function. The eGFR calculation will not be reliable in acutely ill patients when serum creatinine is changing rapidly. It is not useful in patients on dialysis. The eGFR calculation may not be applicable to patients at the low and high extremes of body sizes, pregnant women, and vegetarians.)  Anion Gap 7  Result Comment POTASSIUM/BUN - Slight hemolysis, interpret results with  - caution.  Result(s) reported on 17 Oct 2014 at 06:43AM.    11:19   Glucose, Serum  296  BUN  45  Creatinine (comp) 1.29  Sodium, Serum 136  Potassium, Serum 5.0  Chloride,  Serum 104  CO2, Serum 22  Calcium (Total), Serum 8.6  Osmolality (calc) 294  eGFR (African American)  53  eGFR (Non-African American)  44 (eGFR values <683mmin/1.73 m2 may be an indication of chronic kidney disease (CKD). Calculated eGFR, using the MRDR Study equation, is useful in  patients with stable renal function. The eGFR calculation will not be reliable in acutely ill patients when serum creatinine is changing rapidly. It is not useful in patients on dialysis. The eGFR calculation may not be applicable to patients at the low and high extremes of body sizes, pregnant women, and vegetarians.)  Anion Gap 10  Routine Hem:  01-Jan-16 03:25   WBC (CBC) 5.7  RBC (CBC) 3.91  Hemoglobin (CBC)  10.1  Hematocrit (CBC)  32.0  Platelet Count (CBC)  123 (Result(s) reported on 17 Oct 2014 at 06:43AM.)  MCV 82  MCH  25.8  MCHC  31.5  RDW  23.4  Segmented Neutrophils 93  Lymphocytes 3  Monocytes 4  Diff Comment 1 ANISOCYTOSIS  Diff Comment 2 POIKILOCYTOSIS  Diff Comment 3 HYPOCHROMIA  Diff Comment 4 PLTS VARIED IN SIZE  Result(s) reported on 17 Oct 2014 at 06:43AM.   Assessment/Plan:  Assessment/Plan:  Assessment SMALL CELL LUNG CANCER, CHEMOTX  S/P 2 DAYS  OF 3 PLANNED DOSES.  TODAY , RECHECKED . K, CRE AND LFTS...FOUND BILI SIGNIFICANTLY HIGHER AT 14.3. PENDING IS REPEAT URIC ACID, AND CBC, AND CHECKING  DIRECT BILI,  CONCERNE FOR POSSIBLE HEMOLYSIS VS PROGRESSIVE LIVER DISEASE , PROGRESSIVE TUMOR, DRUG TOXICITY, OR OBSTRUCTION. Marland Kitchen   Plan D/C DAY 3 OF VP 16.  RECHECK CBC, URIC ACID. F/U MET C. CHECK LIVER U/S FOR BILIARY OBSTRUCTION. CONSULTED PHARMACY TO ADJUST OTHER MEDS FOR HEPATIC FAILURE   Electronic Signatures: Dallas Schimke (MD)  (Signed 01-Jan-16 12:41)  Authored: Chief Complaint, VITAL SIGNS/ANCILLARY NOTES, Brief Assessment, Lab Results, Assessment/Plan   Last Updated: 01-Jan-16 12:41 by Dallas Schimke (MD)

## 2015-02-11 NOTE — H&P (Signed)
 PATIENT NAME:  Madelyn FlavorsBRADSHAW, Samon T MR#:  [id number] DATE OF BIRTH:  Sep 08, 1951  DATE OF ADMISSION:  10/07/2014  HISTORY OF PRESENT ILLNESS:  The patient is a 64 year old female who recently was diagnosed with left hilar mass and postobstructive pneumonia, history of chronic obstructive pulmonary disease, type 2 diabetes, hypertension, back pain, osteoarthritis, gastroesophageal reflux disease who presents to the clinic complaining of abdominal pain and also back pain. The patient had recently been admitted for COPD exacerbation and postobstructive pneumonia had been sent home on prednisone taper as well as antibiotic therapy, but subsequently returned to the ED, and CT scan of the abdomen and pelvis showed evidence of nodularity of the left lower lobe with pneumonia. The patient also had nodular liver with small volume ascites suggesting cirrhosis and marked portal and porta hepatis lymphadenopathy in the gastrohepatic ligament which could be related to cirrhosis and the patient also had nodularity of the left adrenal gland and nodules within the subcutaneous ventral abdominal wall. The patient continues to be in pain and also has noted significant abdominal distention, poor appetite and nausea. She states that her cough and shortness of breath is better and she is currently down to 20 mg of prednisone on a tapering schedule.   PHYSICAL EXAMINATION:  GENERAL: She was anxious, tearful, jaundiced, blood pressure was 100/62, pulse was 134, weight 171 pounds, oxygen saturation 93% on 3 liters nasal cannula.   SKIN: Jaundiced, also yellowish.   NECK: Supple.   HEART: S1, S2.   LUNGS: Decreased air entry bilaterally with scattered expiratory wheezes.   ABDOMEN: Soft and distended.  EXTREMITIES:  No edema.   NEUROLOGIC: Nonfocal.   ASSESSMENT AND PLAN:  1.  Left hilar mass with postobstructive pneumonia.  2.  Abnormal liver function tests with jaundice and ascites, most likely related to metastasis with  cirrhosis also possibility.  3.  Generalized weakness and worsening pain.  4.  Type 2 diabetes.  5.  Gastroesophageal reflux disease.  6.  Anxiety and depression.   PLAN: [facility]. Continue her inhaled bronchodilator therapy including nebulizer therapy.  Will involve palliative care for pain management. Will also consult hematology oncology and consult gastroenterology: Will place the patient on sliding scale insulin for diabetic management and hold off her blood pressure medications at this time. Discussed the case with the patient and her sister who are both in agreement. I also discussed the case with Dr. Meredeth IdeFleming, pulmonologist.   ____________________________ Barbette ReichmannVishwanath Matas Burrows, MD vh:at D: 10/07/2014 10:55:50 ET T: 10/07/2014 11:48:54 ET JOB#: [id number]  cc: Barbette ReichmannVishwanath Jeancarlo Leffler, MD, <Dictator> Barbette ReichmannVISHWANATH Winn Muehl MD ELECTRONICALLY SIGNED  13:02

## 2015-02-15 NOTE — Discharge Summary (Signed)
PATIENT NAME:  Madelyn FlavorsBRADSHAW, Danielle T MR#:  [id number] DATE OF BIRTH:  25-Sep-1951  DATE OF ADMISSION:  10/07/2014 DATE OF DISCHARGE:  10/21/2014  FINAL DIAGNOSES:  1.  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  2.  Chronic obstructive pulmonary disease with postobstructive pneumonia. Postobstructive pneumonia due to lung cancer.  3.  Type 2 diabetes mellitus, uncontrolled.  4.  Chronic obstructive pulmonary disease with chronic respiratory failure.  5.  Acute liver failure secondary to metastatic lung cancer.   HISTORY AND PHYSICAL: Please see dictated admission history and physical.   SUMMARY OF HOSPITAL COURSE: The patient was admitted with shortness of breath, pneumonia. Investigation revealed evidence of postobstructive pneumonia secondary to hilar mass, and PET scan subsequently revealed diffuse involvement of the liver. Further investigation revealed evidence of lung cancer. Oncology followed the patient and chemotherapy was initiated. Unfortunately with chemotherapy the patient had continued decline in her overall course, with progressive worsening of liver failure. Palliative care followed as well. It became clear that the patient would not be able to tolerate any further chemotherapy secondary to her worsening liver failure, and she did not wish to pursue any other more aggressive measures and wanted to concentrate on attempts for comfort. After discussion she agreed for transfer to hospice home. She was transferred there on 10/21/2014. Her diet was to be as tolerated with activity as tolerated. The Foley catheter was left for urinary incontinence and to prevent skin breakdown. She was continued on oxygen as an outpatient as well.   DISCHARGE MEDICATIONS:  1. Prednisone 50 mg p.o. daily.  2. Neurontin 300 mg p.o. t.i.d.  3. Albuterol SVN q. 4 hours as needed for wheezing.  4. Oxycodone extended release 10 mg p.o. b.i.d.  5. Oxycodone 10 mg immediate release every 4 hours as needed  for severe pain.  6. Lorazepam 0.5 mg p.o. q. 8 hours as needed for anxiety.  7. Diphenhydramine 25 mg p.o. q. 8 hours as needed for itching.  8. Colace 100 mg p.o. b.i.d.  9. Dextromethorphan/guaifenesin 5 mL p.o. q. 6 hours as needed for cough.   She was taken off all of the medications which were not directly tied to comfort measures. She will follow up with the hospice home physician. She was Do Not Resuscitate, an out of facility DNR order went with her to the facility.    ____________________________ Lynnea FerrierBert J. Klein III, MD bjk:bu D: 11/04/2014 12:57:39 ET T: 11/04/2014 13:24:25 ET JOB#: [id number]  cc: Lynnea FerrierBert J. Klein III, MD, <Dictator> Daniel NonesBERT KLEIN MD ELECTRONICALLY SIGNED 11/07/2014 9:22

## 2015-08-14 IMAGING — CT NM PET TUM IMG SKULL BASE T - THIGH
1 of 10 series · 1 of 25 positions shown · non-contrast
Comparison: Chest CT 09/20/2014. CT of the abdomen and pelvis
10/02/2014.

CLINICAL DATA: Initial treatment strategy for lung mass.

EXAM:
NUCLEAR MEDICINE PET SKULL BASE TO THIGH
TECHNIQUE: 12.5 mCi F-18 FDG was injected intravenously. Full-ring PET imaging
was performed from the skull base to thigh after the radiotracer. CT
data was obtained and used for attenuation correction and anatomic
localization.
FASTING BLOOD GLUCOSE:  Value: 89 mg/dl

[Series 3: ct wb 5.0 b30f · axial · 5.0mm · 0.98mm/px · 1 of 329 slices shown]
[im 329/329  brain]
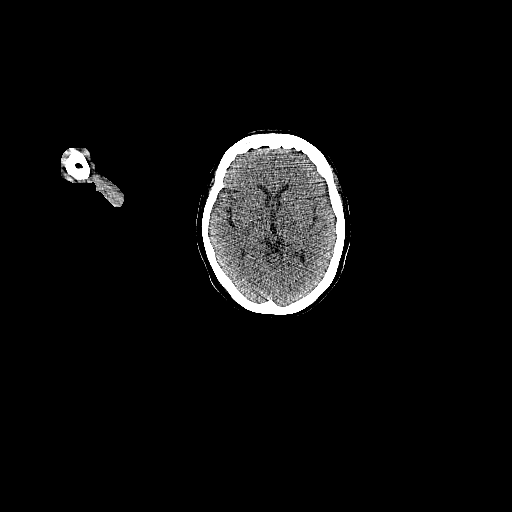

[1 of 25 positions shown; findings below may reference images not displayed]

FINDINGS: NECK

No hypermetabolic lymph nodes in the neck.

CHEST

In the posterior aspect of the superior segment of the left lower
lobe there is a 3.1 x 1.8 cm pleural-based hypermetabolic (SUVmax =
9.8) mass. There is extensive hypermetabolic nodularity extending
retrograde from this mass along the bronchovascular interstitium
toward the left hilum, compatible with lymphangitic spread of
disease. In the left hilum there is a large hypermetabolic (SUVmax =
13.2) nodal mass which is difficult to discretely measure, but
estimated to be approximately 4.5 x 4.3 cm (image 98 of series 3).
Hypermetabolic subcarinal lymphadenopathy measuring up to 11 mm in
short axis (SUVmax = 5.9), and middle mediastinal lymphadenopathy to
the left side of the descending thoracic aorta (SUVmax = 8.2)
measuring up to 12 mm in short axis is noted.

ABDOMEN/PELVIS

There are multiple enlarged upper abdominal lymph nodes, most
notably in the gastrohepatic ligament (SUVmax = 7.2, measuring 3.3 x
2.2 cm (image 154 of series 3)), and in the portacaval nodal station
(SUVmax = 8.0, measuring 4.3 x 2.5 cm (image 162 of series 3)). The
liver has a very nodular contour, suggestive of underlying
cirrhosis. There is heterogeneous activity rather diffusely
increased throughout the left lobe of the liver (SUVmax = 6.7), and
more normal in the right lobe (SUVmax = 4.1). None of the areas in
the liver which have increased activity have a suspicious mass like
appearance to suggest metastatic disease at this time. Multiple
borderline enlarged and mildly enlarged retroperitoneal lymph nodes,
some of which also demonstrate hypermetabolism, best demonstrated by
a 14 mm short axis node immediately anterior to the left renal
artery (image 155 of series 3), which is hypermetabolic (SUVmax =
4.3). No abnormal hypermetabolic activity within the pancreas,
adrenal glands, or spleen. No hypermetabolic lymph nodes in the
abdomen or pelvis. Status post cholecystectomy. Small volume of
ascites, most notable adjacent to the liver and spleen, and in the
low anatomic pelvis. Tiny ventral hernia containing only omental fat
incidentally noted.

SKELETON

There are multiple foci of hypermetabolism scattered throughout the
axial and appendicular skeleton, compatible with widespread
metastatic disease. The largest lesion is in the right humeral neck
and head which has a soft tissue component, estimated to measure
approximately 3 cm in diameter (SUVmax = 5.9), blood multiple other
foci of hypermetabolism are noted in the proximal femurs
bilaterally, in the spine, and in the ribs. Multiple soft tissue
attenuation lesions in the subcutaneous fat of the anterior
abdominal wall are nonspecific, but demonstrate no hypermetabolism,
presumably benign. The largest of these measures up to 2.1 x 1.6 cm
(image 230 of series 3).
IMPRESSION: 1. Findings, as above, most likely reflect a primary bronchogenic
carcinoma in the superior segment of the left lower lobe with
lymphangitic spread of disease in the left lower lobe, left hilar
and mediastinal adenopathy, metastatic lymphadenopathy in the upper
abdomen, and widespread osseous metastases. Findings are most
suggestive of stage IV lung cancer, and correlation with biopsy to
establish a tissue diagnosis is recommended.
2. Cirrhotic liver with heterogeneous metabolism throughout the
liver, favored to be physiologic related to underlying cirrhosis and
potential heterogeneous perfusion. No discrete hepatic lesion is
identified at this time to strongly suggest either metastatic
disease to the liver, or primary hepatocellular carcinoma.
3. Additional incidental findings, as above.

## 2015-08-24 IMAGING — US ABDOMEN ULTRASOUND LIMITED
1 series · 14 of 25 positions shown · non-contrast
Comparison: PET-CT 10/08/2014.  CT abdomen and pelvis 10/02/2014.

CLINICAL DATA: Two month history of right upper quadrant abdominal
pain. Elevated serum liver enzymes. Recent diagnosis of metastatic
left lower lobe lung cancer. Prior cholecystectomy.

EXAM:
US ABDOMEN LIMITED - RIGHT UPPER QUADRANT

[Series 1: abdomen ultrasound limited · 0.25mm/px · 56 acquisitions, 14 frames shown]
[im 1/56]
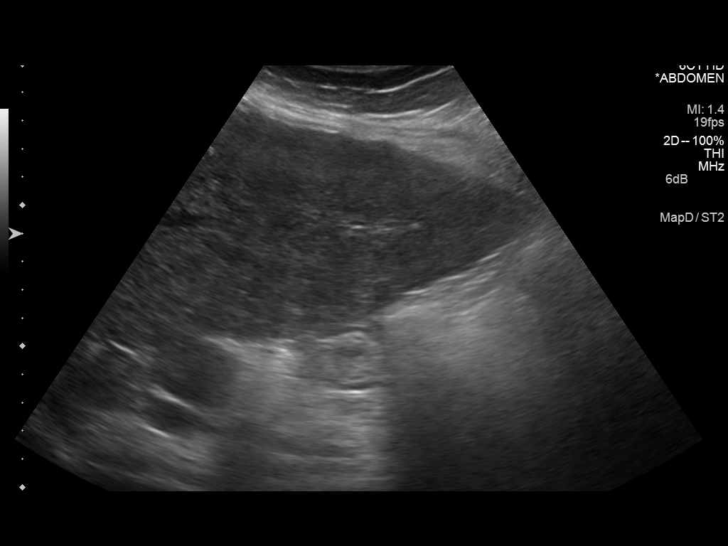
[im 5/56]
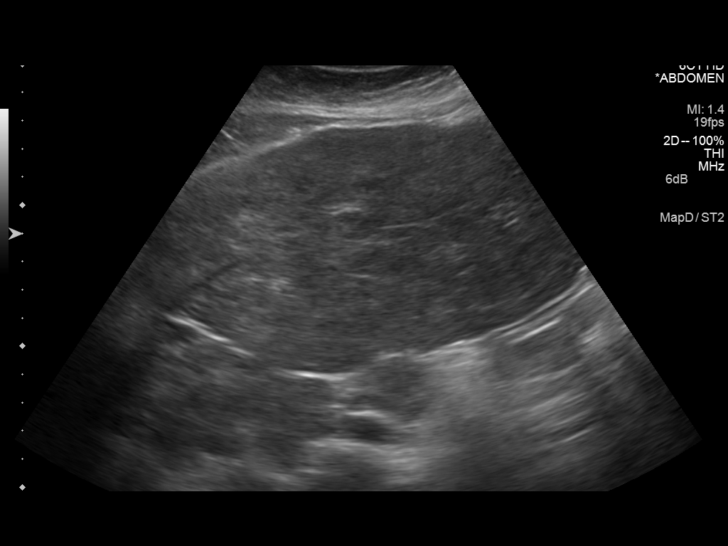
[im 10/56]
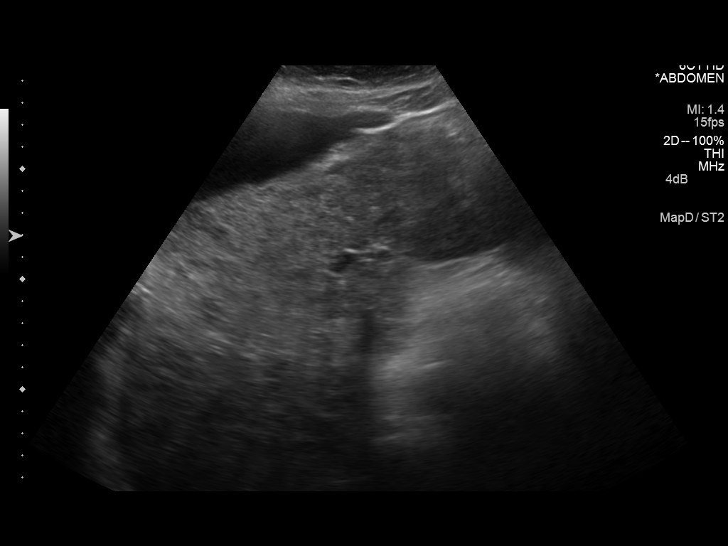
[im 14/56]
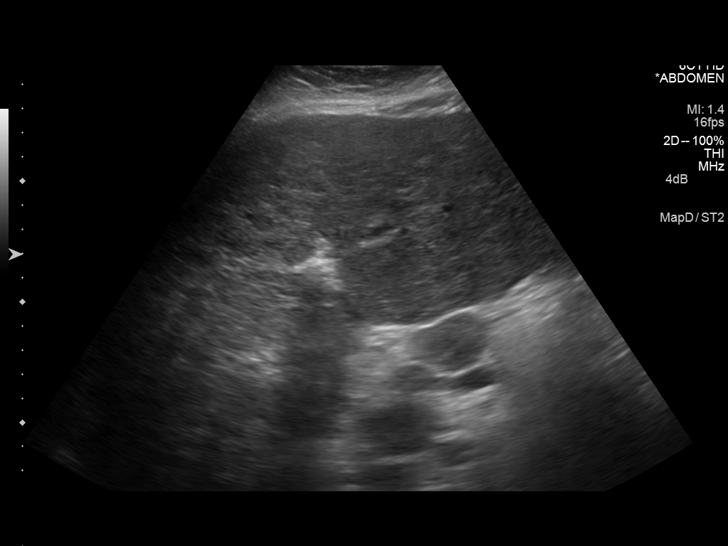
[im 19/56]
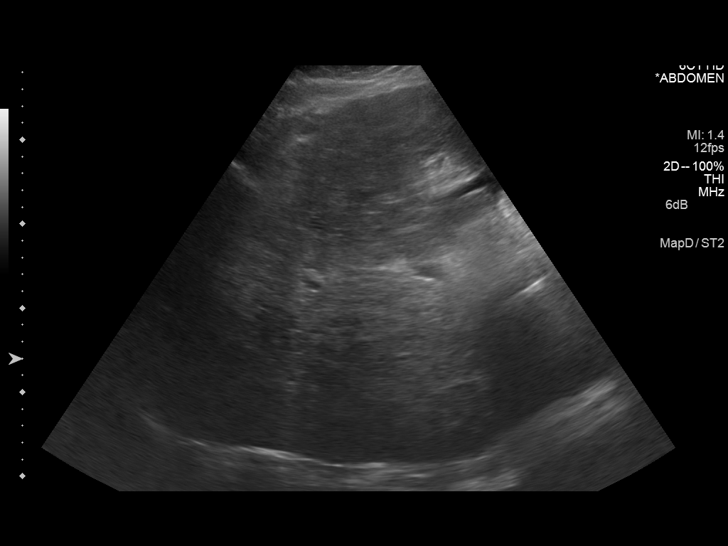
[im 21/56]
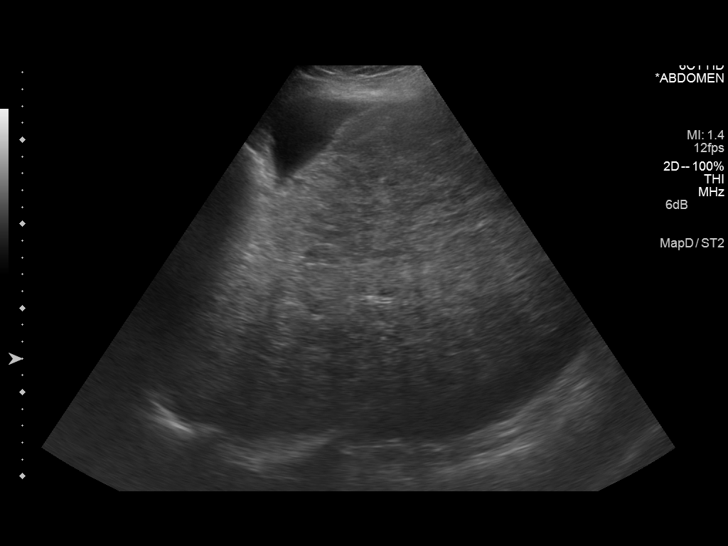
[im 26/56]
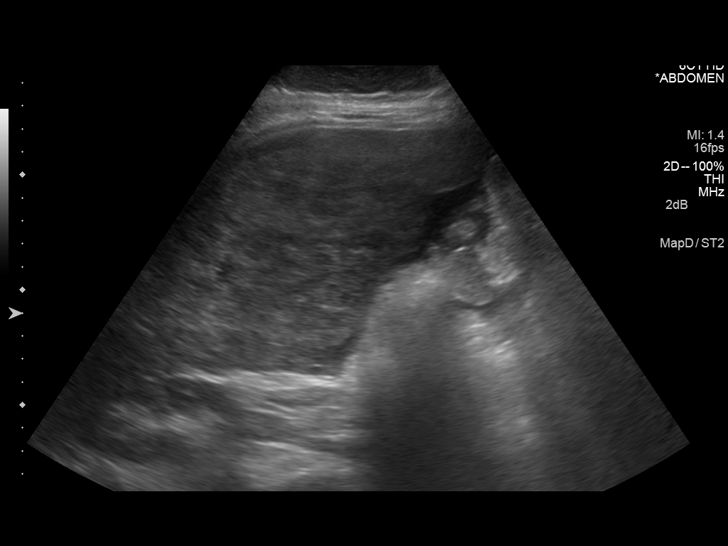
[im 30/56]
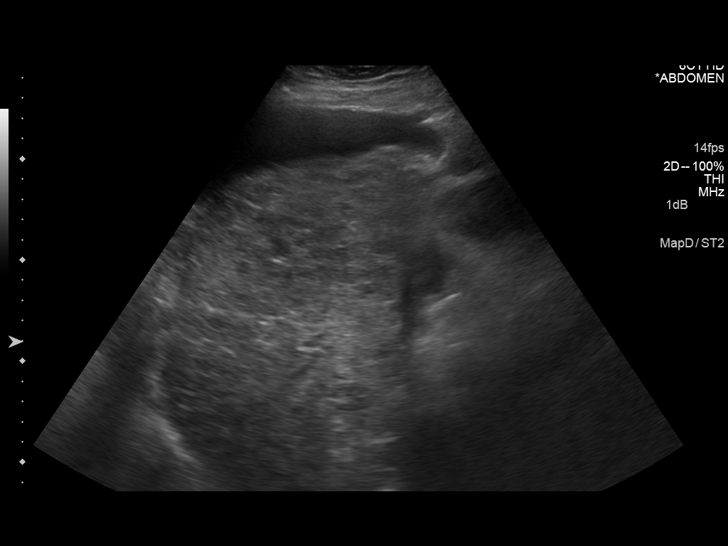
[im 35/56]
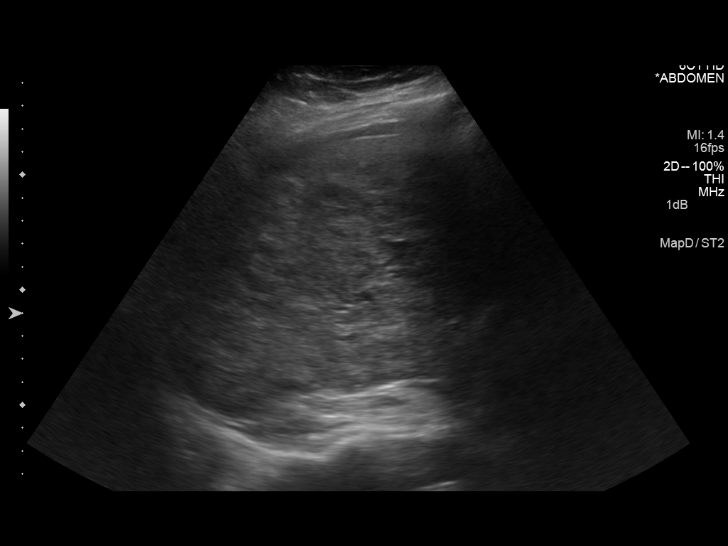
[im 37/56]
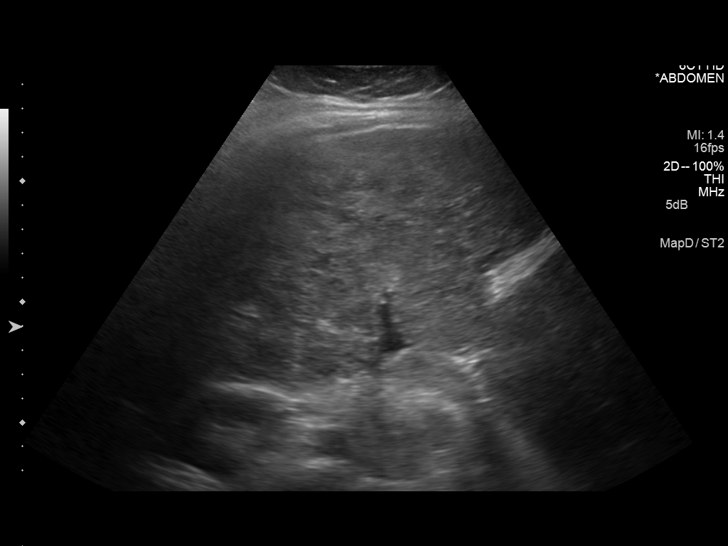
[im 42/56]
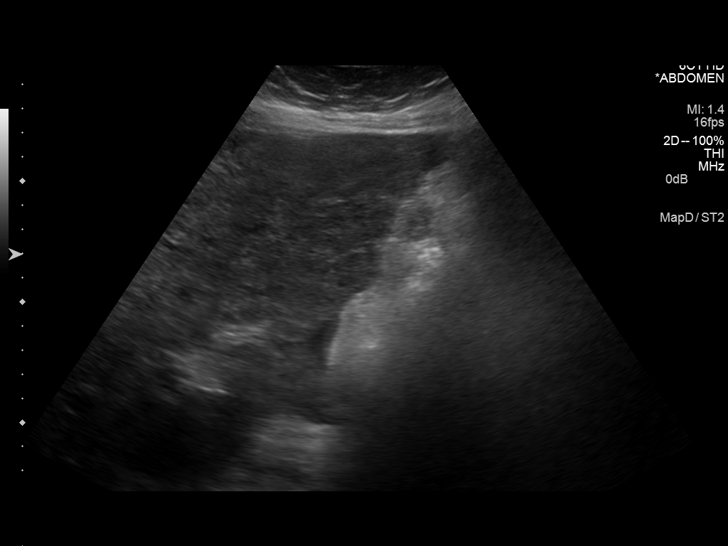
[im 46/56]
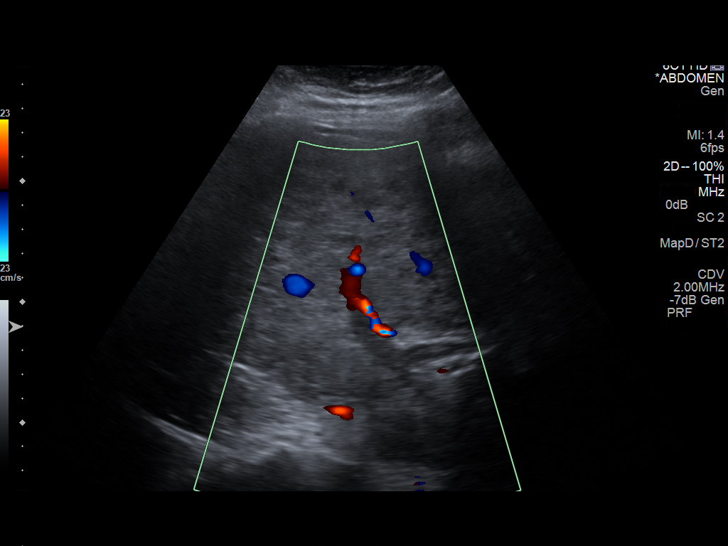
[im 51/56]
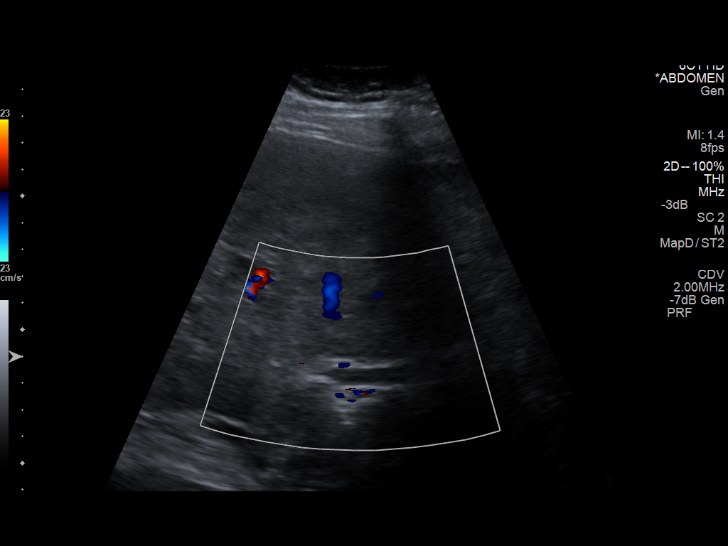
[im 56/56]
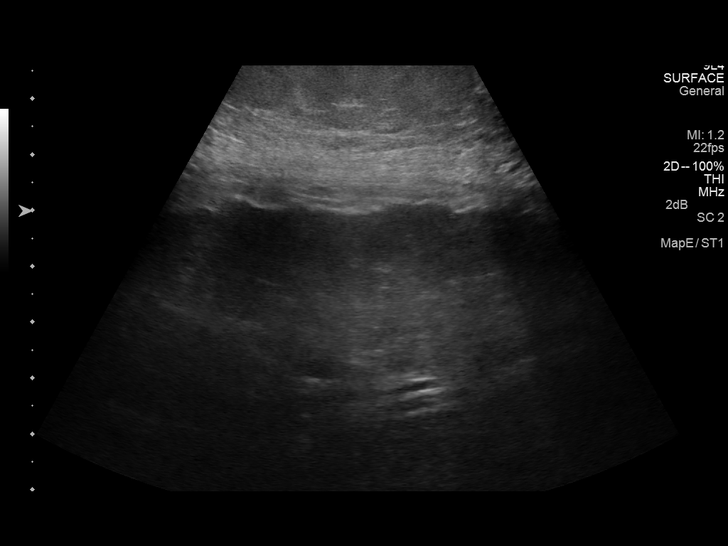

[14 of 25 positions shown; findings below may reference images not displayed]

FINDINGS: Gallbladder:

Surgically absent.

Common bile duct:

Diameter: Approximately 6 mm.

Liver:

Hepatomegaly, irregular hepatic contour, relative enlargement of the
left lobe and caudate lobe, and coarsened echotexture diffusely. No
hepatic parenchymal masses. Possible partial occlusion of the main
portal vein.
IMPRESSION: 1. Hepatic cirrhosis.  No visible hepatic parenchymal masses.
2. Partial thrombosis of the main portal vein is suspected.
3. No biliary ductal dilation post cholecystectomy.

## 2015-08-26 IMAGING — CR DG CHEST 2V
1 series · 2 of 2 positions shown · non-contrast
Comparison: 10/08/2014

CLINICAL DATA: COPD, postobstructive pneumonia, left hilar mass

EXAM:
CHEST  2 VIEW

[Series 1: dxr chest pa (or ap) and lateral · 0.14mm/px · 2 of 2 slices shown]
[im 1/2]
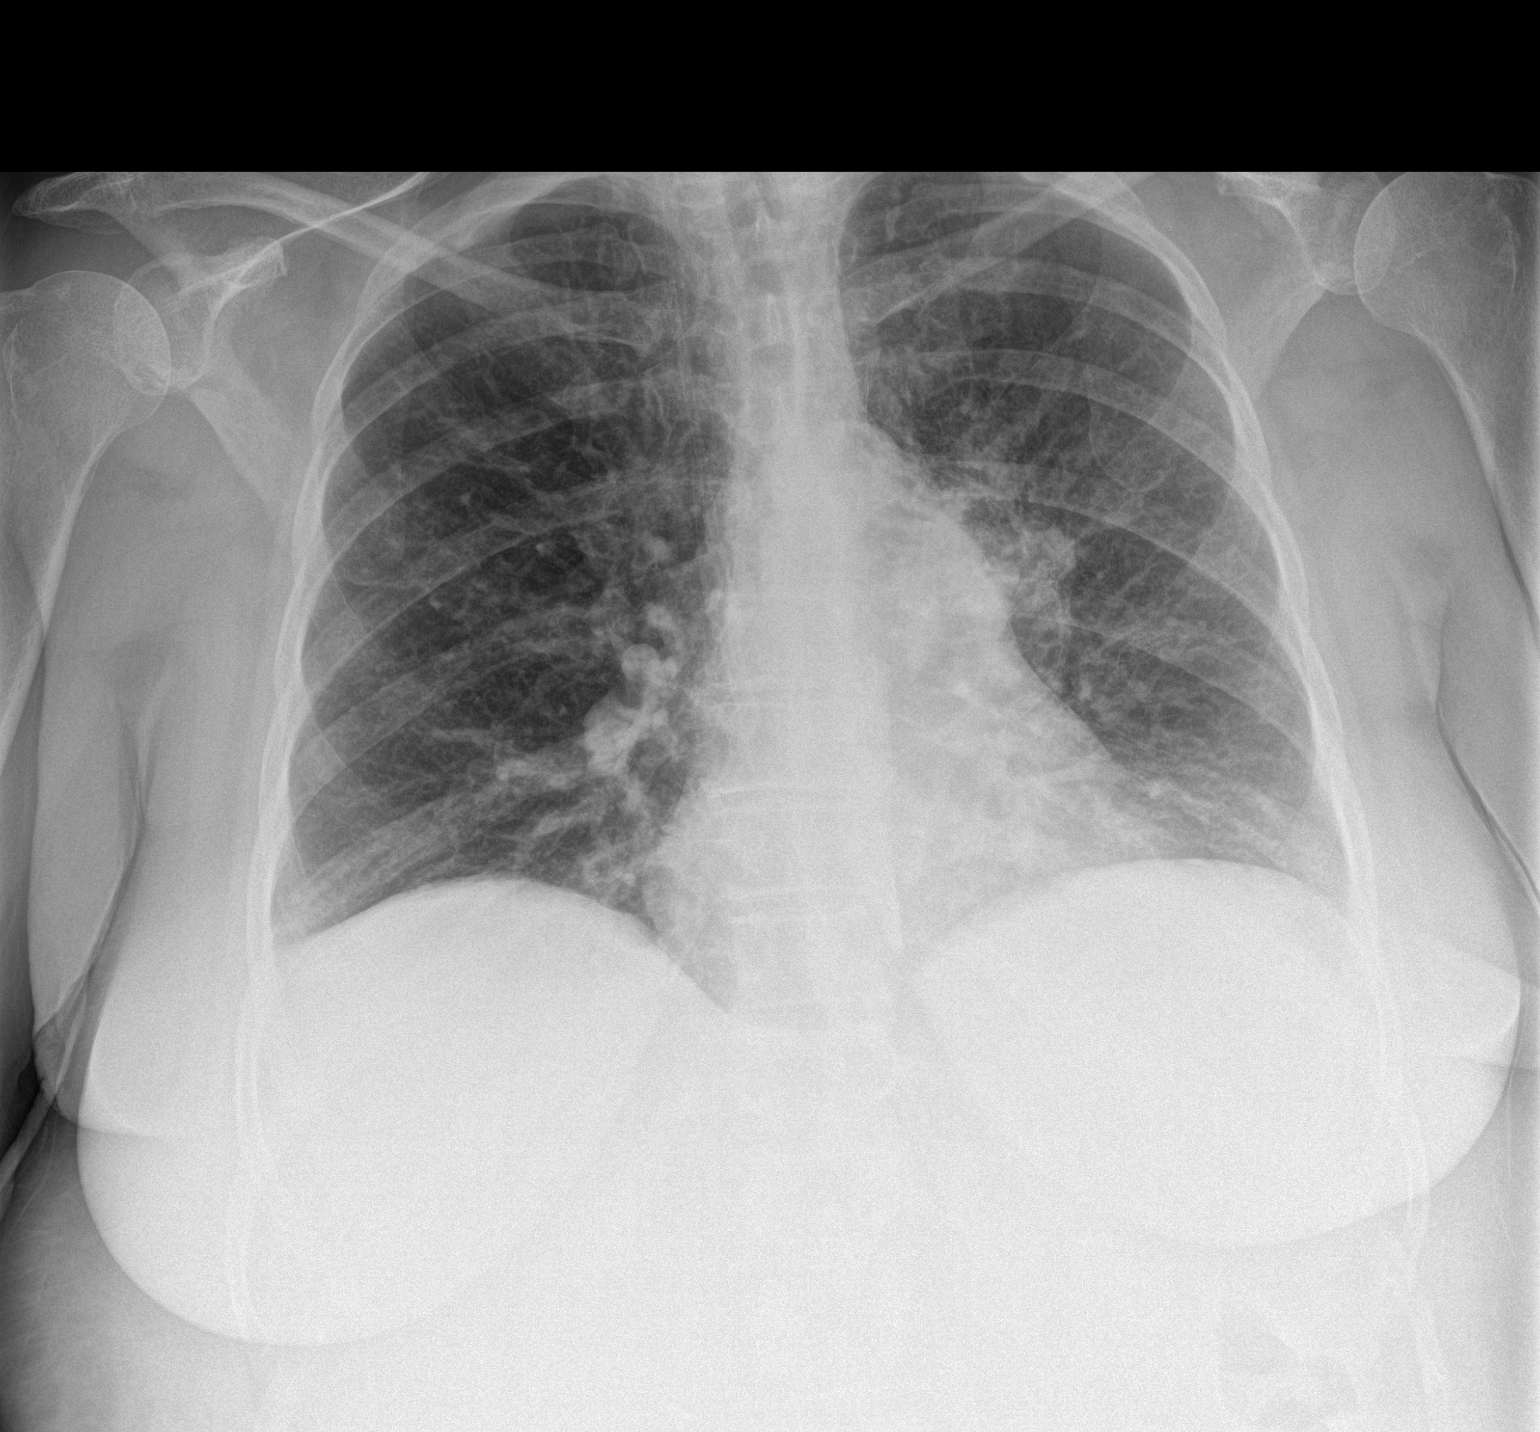
[im 2/2]
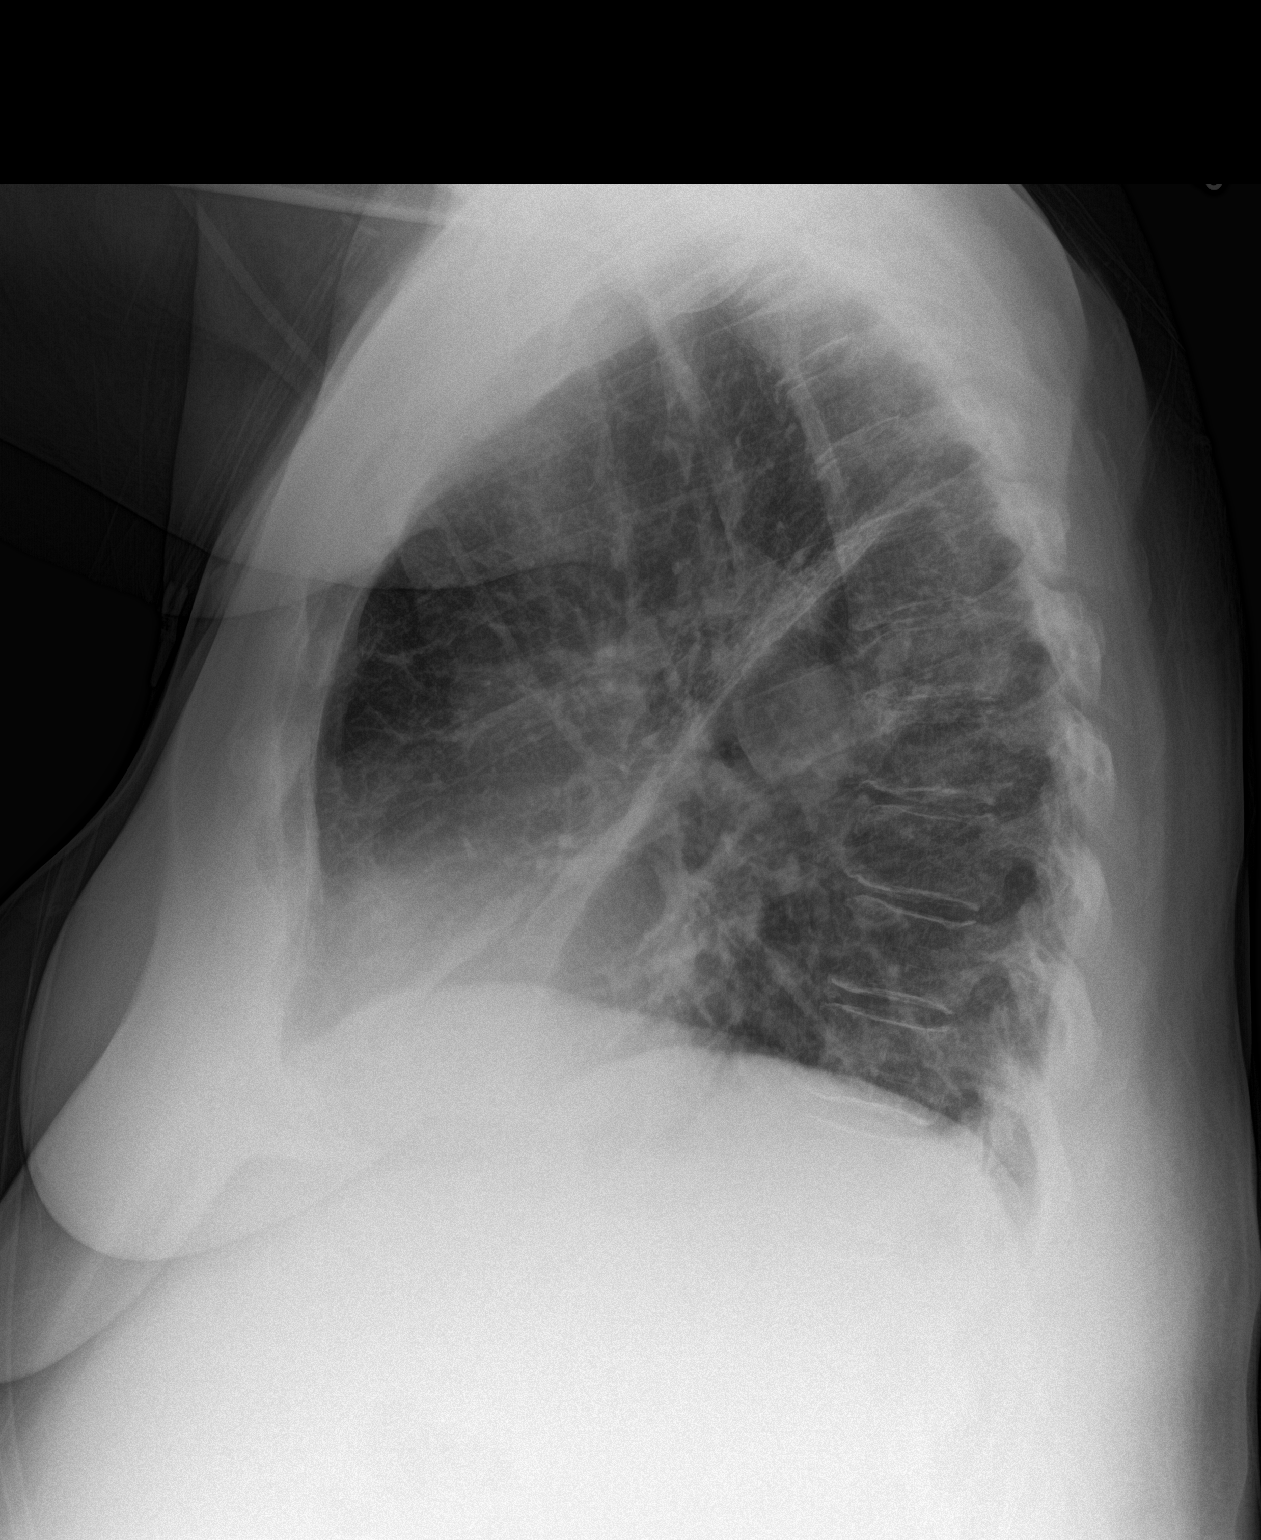

[2 of 2 positions shown; findings below may reference images not displayed]

FINDINGS: Cardiac size is stable. Persistent left retro hilar mass. Small
amount of fluid in left major fissure. Mild left perihilar
infiltrate/pneumonia. No pulmonary edema. Right lung is clear. Mild
degenerative changes thoracic spine.
IMPRESSION: Left retro hilar mass stable. Small amount of fluid in left major
fissure. Mild left perihilar infiltrate/pneumonia.
